# Patient Record
Sex: Female | Born: 1950 | Race: Black or African American | Hispanic: No | Marital: Married | Smoking: Former smoker
Health system: Southern US, Community
[De-identification: ages and names within clinical notes are randomized; demographics above are authoritative.]

## PROBLEM LIST (undated history)

## (undated) DIAGNOSIS — E119 Type 2 diabetes mellitus without complications: Secondary | ICD-10-CM

## (undated) DIAGNOSIS — R319 Hematuria, unspecified: Secondary | ICD-10-CM

## (undated) DIAGNOSIS — H269 Unspecified cataract: Secondary | ICD-10-CM

## (undated) DIAGNOSIS — E785 Hyperlipidemia, unspecified: Secondary | ICD-10-CM

## (undated) DIAGNOSIS — I10 Essential (primary) hypertension: Secondary | ICD-10-CM

## (undated) DIAGNOSIS — M8580 Other specified disorders of bone density and structure, unspecified site: Secondary | ICD-10-CM

## (undated) HISTORY — DX: Unspecified cataract: H26.9

## (undated) HISTORY — PX: VAGINAL HYSTERECTOMY: SUR661

## (undated) HISTORY — PX: EYE SURGERY: SHX253

## (undated) HISTORY — DX: Hyperlipidemia, unspecified: E78.5

## (undated) HISTORY — DX: Other specified disorders of bone density and structure, unspecified site: M85.80

## (undated) HISTORY — DX: Type 2 diabetes mellitus without complications: E11.9

## (undated) HISTORY — PX: FOOT SURGERY: SHX648

## (undated) HISTORY — DX: Hematuria, unspecified: R31.9

## (undated) HISTORY — DX: Essential (primary) hypertension: I10

---

## 1997-11-13 ENCOUNTER — Ambulatory Visit (HOSPITAL_COMMUNITY): Admission: RE | Admit: 1997-11-13 | Discharge: 1997-11-13 | Payer: Self-pay | Admitting: General Surgery

## 2000-06-19 ENCOUNTER — Encounter: Admission: RE | Admit: 2000-06-19 | Discharge: 2000-09-17 | Payer: Self-pay | Admitting: Family Medicine

## 2000-07-17 ENCOUNTER — Other Ambulatory Visit: Admission: RE | Admit: 2000-07-17 | Discharge: 2000-07-17 | Payer: Self-pay | Admitting: Obstetrics and Gynecology

## 2000-11-29 ENCOUNTER — Encounter: Admission: RE | Admit: 2000-11-29 | Discharge: 2001-02-27 | Payer: Self-pay | Admitting: Family Medicine

## 2002-05-02 ENCOUNTER — Encounter: Admission: RE | Admit: 2002-05-02 | Discharge: 2002-05-02 | Payer: Self-pay | Admitting: Family Medicine

## 2002-05-02 ENCOUNTER — Encounter: Payer: Self-pay | Admitting: Family Medicine

## 2004-02-25 ENCOUNTER — Other Ambulatory Visit: Admission: RE | Admit: 2004-02-25 | Discharge: 2004-02-25 | Payer: Self-pay | Admitting: Obstetrics and Gynecology

## 2006-07-03 ENCOUNTER — Emergency Department (HOSPITAL_COMMUNITY): Admission: EM | Admit: 2006-07-03 | Discharge: 2006-07-03 | Payer: Self-pay | Admitting: Emergency Medicine

## 2007-01-04 ENCOUNTER — Encounter: Admission: RE | Admit: 2007-01-04 | Discharge: 2007-01-04 | Payer: Self-pay | Admitting: Family Medicine

## 2007-08-07 ENCOUNTER — Observation Stay (HOSPITAL_COMMUNITY): Admission: EM | Admit: 2007-08-07 | Discharge: 2007-08-07 | Payer: Self-pay | Admitting: Emergency Medicine

## 2008-11-12 IMAGING — CT CT HEAD W/O CM
1 series · 16 of 30 positions shown, 20 images · IV contrast (agent unspecified)
Comparison: none

CLINICAL DATA: Headache, right arm tingling and numbness. 
 HEAD CT WITHOUT CONTRAST:
TECHNIQUE: Contiguous axial images were obtained from the base of the skull through the vertex according to standard protocol without contrast.

[Series 2: headseq 4.8 h45s · axial · 0.43mm/px · z∈[+1306,+1437]mm · 16 of 30 slices shown, 20 images]
[im 2/30  brain]
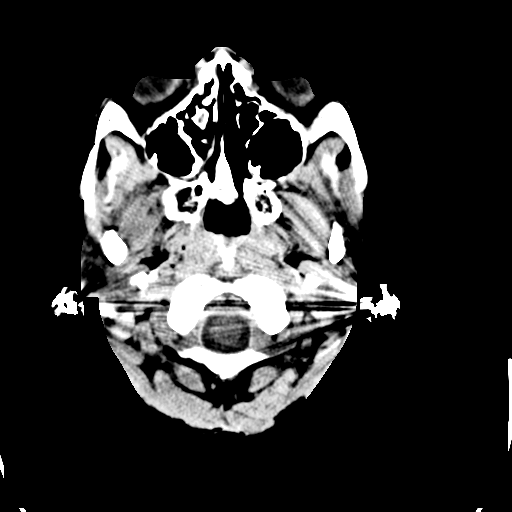
[im 2/30  bone]
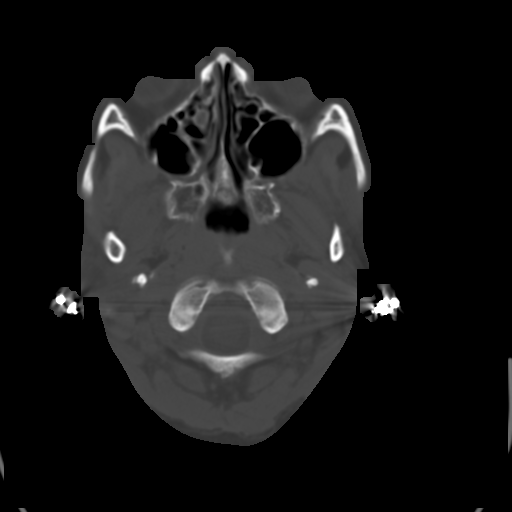
[im 4/30  brain]
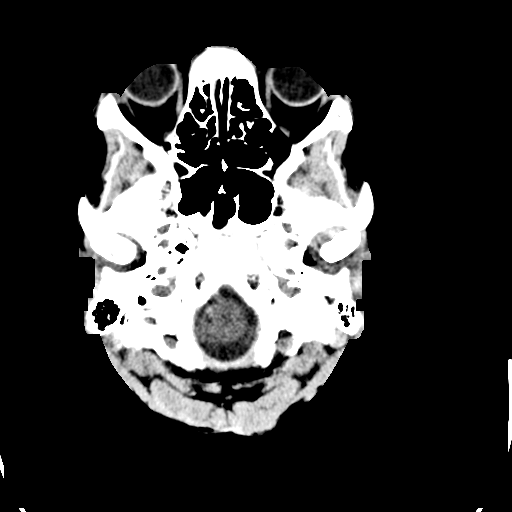
[im 6/30  brain]
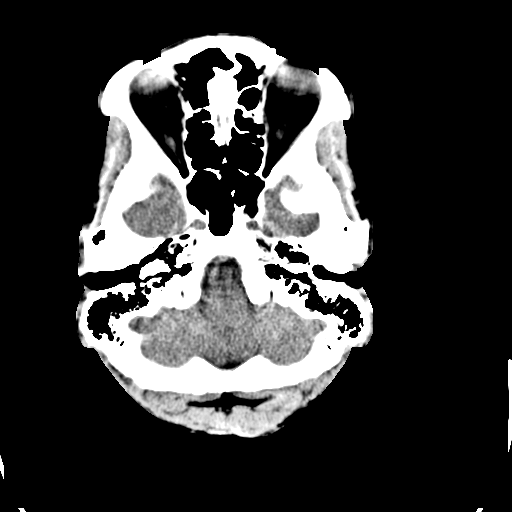
[im 8/30  brain]
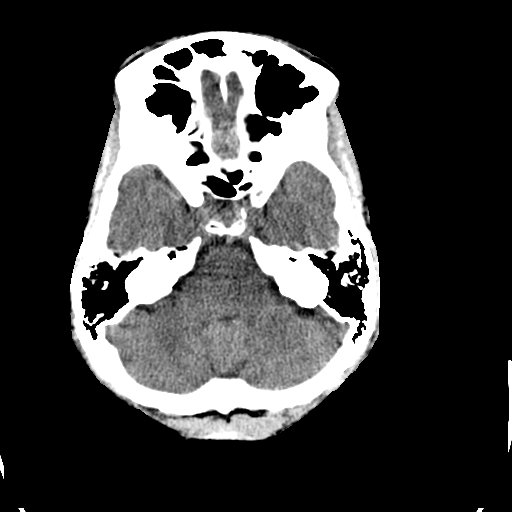
[im 9/30  brain]
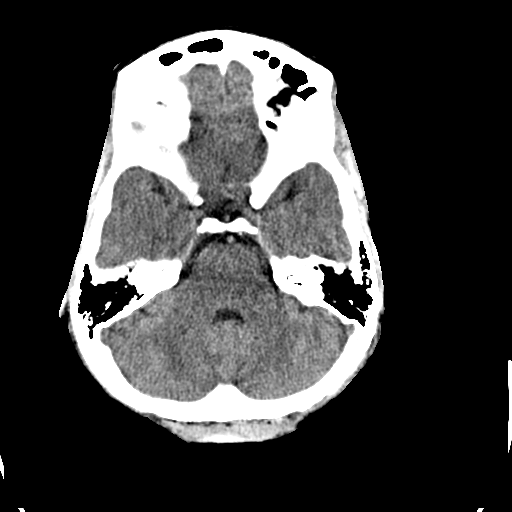
[im 9/30  bone]
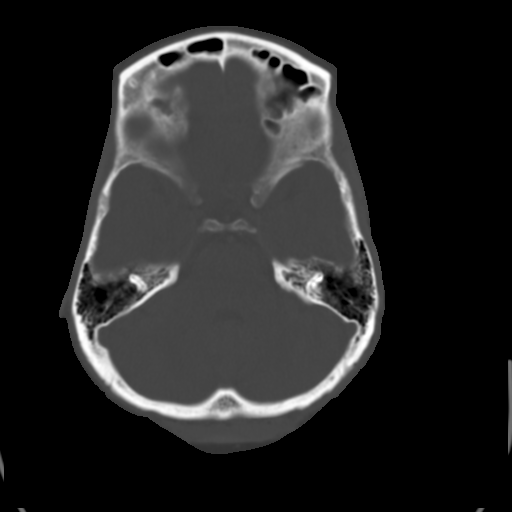
[im 11/30  brain]
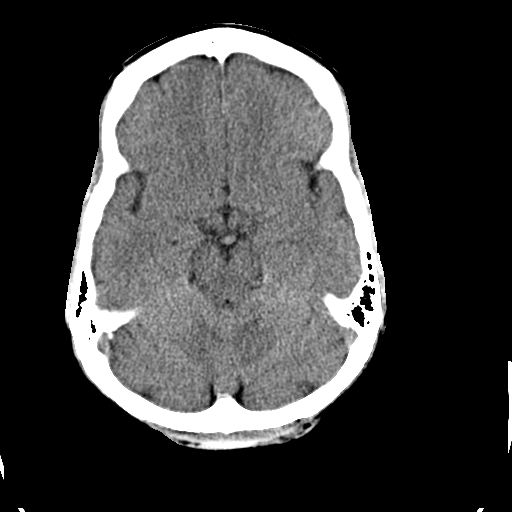
[im 13/30  brain]
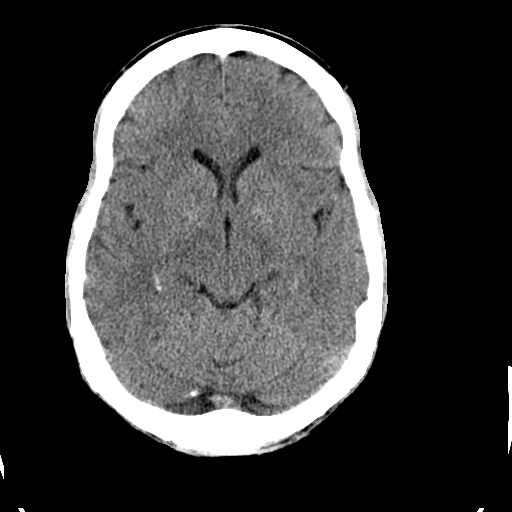
[im 15/30  brain]
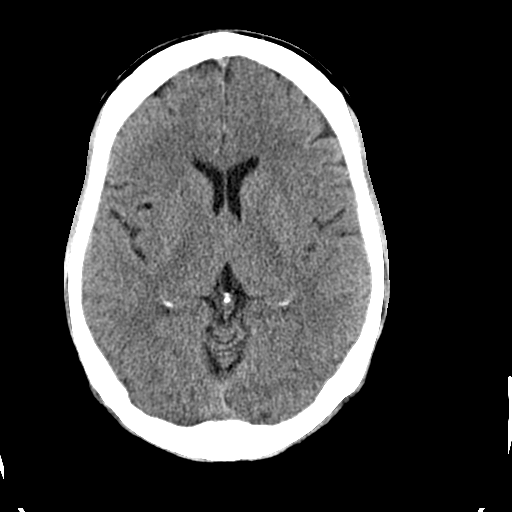
[im 16/30  brain]
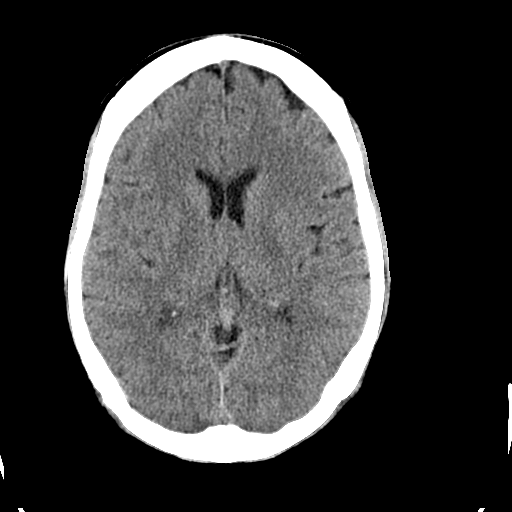
[im 16/30  bone]
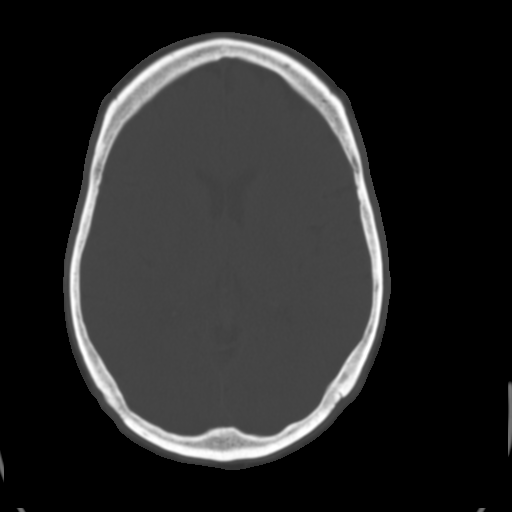
[im 18/30  brain]
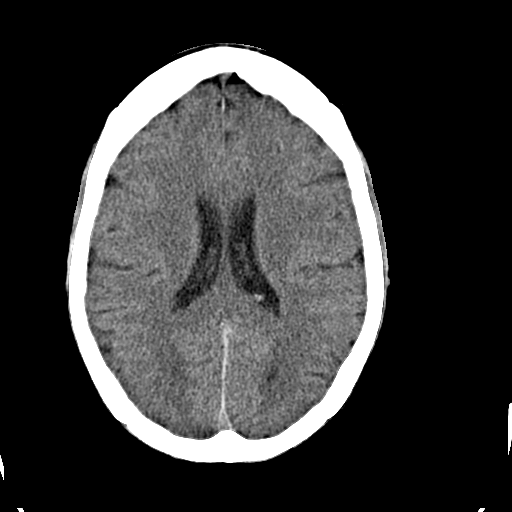
[im 20/30  brain]
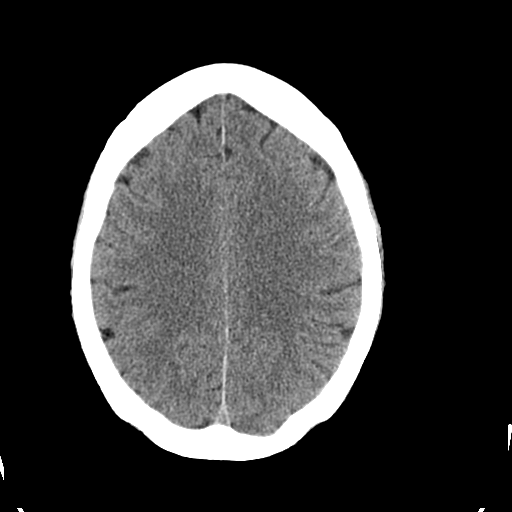
[im 22/30  brain]
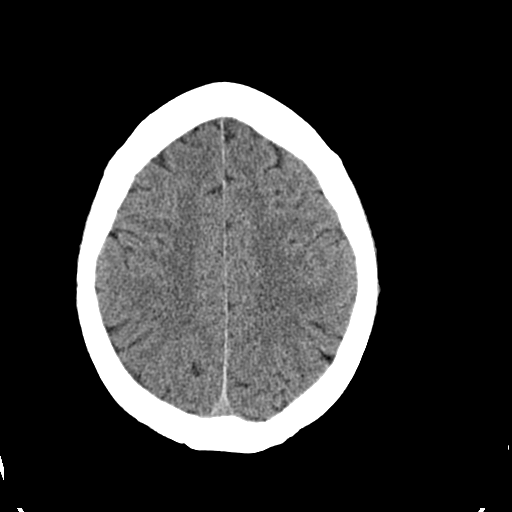
[im 23/30  brain]
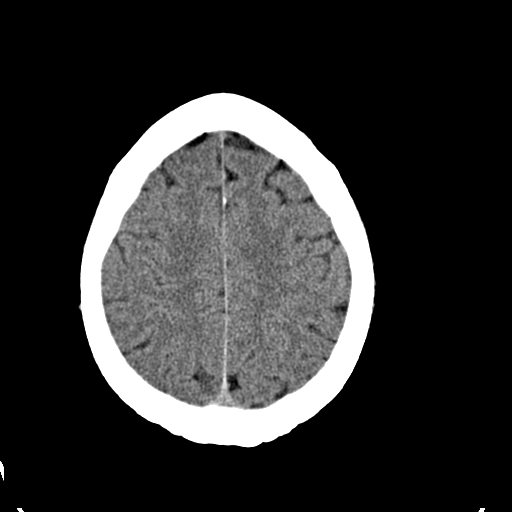
[im 23/30  bone]
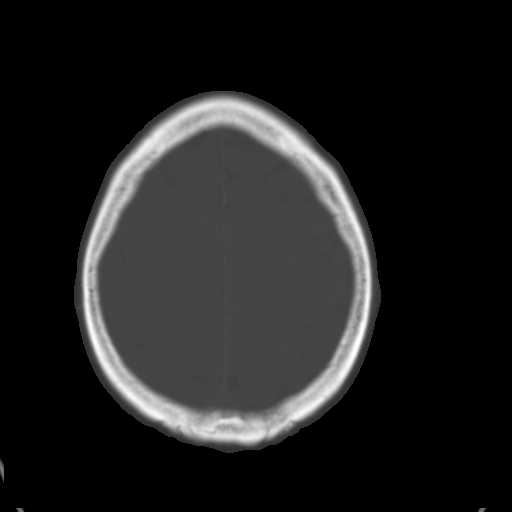
[im 25/30  brain]
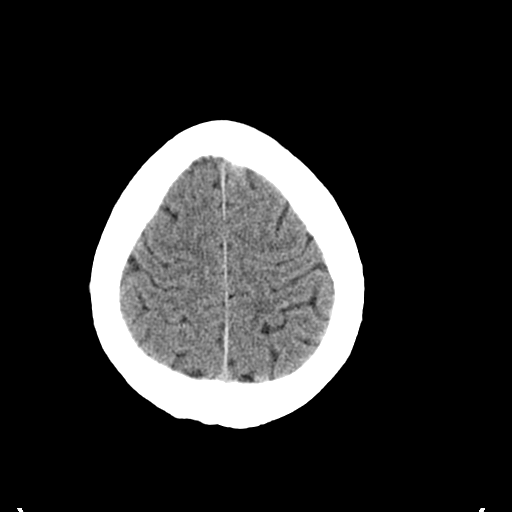
[im 27/30  brain]
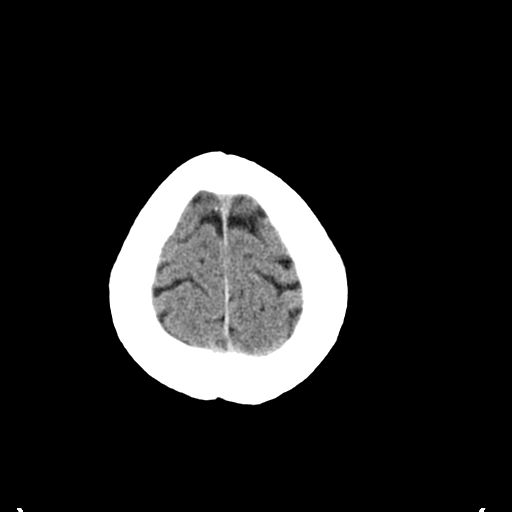
[im 29/30  brain]
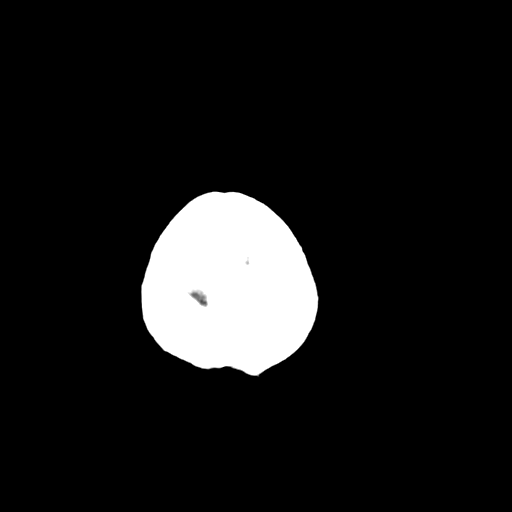

[16 of 30 positions shown; findings below may reference images not displayed]

FINDINGS: No evidence of acute intracranial abnormality identified including mass or mass effect, hydrocephalus, extra-axial fluid collection, midline shift, hemorrhage, or infarct.  Slight increased density in bilateral basal ganglia is compatible with developing calcification.  The visualized bony calvarium and paranasal sinuses are within normal limits.
IMPRESSION: No evidence of acute intracranial abnormality.

## 2008-12-12 ENCOUNTER — Emergency Department (HOSPITAL_COMMUNITY): Admission: EM | Admit: 2008-12-12 | Discharge: 2008-12-12 | Payer: Self-pay | Admitting: Emergency Medicine

## 2008-12-24 ENCOUNTER — Ambulatory Visit (HOSPITAL_COMMUNITY): Admission: RE | Admit: 2008-12-24 | Discharge: 2008-12-24 | Payer: Self-pay | Admitting: Psychiatry

## 2009-01-06 ENCOUNTER — Ambulatory Visit (HOSPITAL_COMMUNITY): Payer: Self-pay | Admitting: Psychiatry

## 2009-01-18 ENCOUNTER — Ambulatory Visit (HOSPITAL_COMMUNITY): Payer: Self-pay | Admitting: Psychiatry

## 2009-02-02 ENCOUNTER — Ambulatory Visit (HOSPITAL_COMMUNITY): Payer: Self-pay | Admitting: Psychiatry

## 2009-02-09 ENCOUNTER — Ambulatory Visit (HOSPITAL_COMMUNITY): Payer: Self-pay | Admitting: Psychiatry

## 2009-02-22 ENCOUNTER — Ambulatory Visit (HOSPITAL_COMMUNITY): Payer: Self-pay | Admitting: Psychiatry

## 2009-03-09 ENCOUNTER — Ambulatory Visit (HOSPITAL_COMMUNITY): Payer: Self-pay | Admitting: Psychiatry

## 2009-03-25 ENCOUNTER — Ambulatory Visit (HOSPITAL_COMMUNITY): Payer: Self-pay | Admitting: Psychiatry

## 2009-04-01 ENCOUNTER — Ambulatory Visit (HOSPITAL_COMMUNITY): Payer: Self-pay | Admitting: Psychiatry

## 2009-04-20 ENCOUNTER — Ambulatory Visit (HOSPITAL_COMMUNITY): Payer: Self-pay | Admitting: Psychiatry

## 2009-05-04 ENCOUNTER — Ambulatory Visit (HOSPITAL_COMMUNITY): Payer: Self-pay | Admitting: Psychiatry

## 2009-05-20 ENCOUNTER — Ambulatory Visit (HOSPITAL_COMMUNITY): Payer: Self-pay | Admitting: Psychiatry

## 2009-06-07 ENCOUNTER — Ambulatory Visit (HOSPITAL_COMMUNITY): Payer: Self-pay | Admitting: Psychiatry

## 2009-06-30 ENCOUNTER — Ambulatory Visit (HOSPITAL_COMMUNITY): Payer: Self-pay | Admitting: Psychiatry

## 2009-07-13 ENCOUNTER — Ambulatory Visit (HOSPITAL_COMMUNITY): Payer: Self-pay | Admitting: Psychiatry

## 2009-08-17 ENCOUNTER — Ambulatory Visit (HOSPITAL_COMMUNITY): Payer: Self-pay | Admitting: Psychiatry

## 2010-07-08 NOTE — Discharge Summary (Signed)
Kristen Potter, Kristen Potter                ACCOUNT NO.:  [id number]   MEDICAL RECORD NO.:  [id number]         PATIENT TYPE:  COBV   LOCATION:                               FACILITY:  [facility]   PHYSICIAN:  Madaline Savage, M.D.     DATE OF BIRTH:   DATE OF ADMISSION:  08/07/2007  DATE OF DISCHARGE:  08/08/2007                               DISCHARGE SUMMARY   DISCHARGE DIAGNOSES:  1. Left arm and chest discomfort, myocardial infarction, ruled out.  2. Treated hypertension.  3. Treated dyslipidemia.  4. Non-insulin diabetes.  5. Gastroesophageal reflux.  6. History of IV contrast and aspirin intolerance.   HOSPITAL COURSE:  The patient is a 60 year old female known to Dr.  Elsie Lincoln.  She presented with numbness and tingling in her left hand and  left shoulder and left chest.  She has multiple risk factors for  coronary disease.  She was seen by Dr. Elsie Lincoln.  He felt her symptoms  were very atypical for angina and felt her symptoms were probably from  some type of nerve impingement syndrome.  After discussion with Dr.  Elsie Lincoln, it was decided to discharge the patient on September 07, 2007.  She  will have noninvasive testing in the future.   DISCHARGE MEDICATIONS:  1. Lovastatin 40 mg a day.  2. Glipizide 2.5 mg a day.   LABS:  EKG shows sinus rhythm without acute changes.  White count 4.9,  hemoglobin 13.3, hematocrit 38.3, platelets 201, INR 1.0, sodium 139,  potassium 3.4, BUN 13, and creatinine 1.93.  LFTs were normal.  CK-MB,  troponins were negative x2.  Cholesterol is 184, HDL 69, and LDL 110.   DISPOSITION:  The patient is discharged in stable condition and will be  contacted by our office to have further evaluation as an outpatient.      Abelino Derrick, P.A.    ______________________________  Madaline Savage, M.D.    Lenard Lance  D:  08/28/2007  T:  08/29/2007  Job:  [id number]   cc:   Mosetta Putt, M.D.

## 2010-11-17 LAB — POCT CARDIAC MARKERS
CKMB, poc: 1.2
CKMB, poc: 1.6
Myoglobin, poc: 82.9
Operator id: 277751
Troponin i, poc: <0.05

## 2010-11-17 LAB — DIFFERENTIAL
Basophils Relative: 0
Eosinophils Absolute: 0.1
Eosinophils Relative: 2
Monocytes Absolute: 0.4
Monocytes Relative: 7
Neutrophils Relative %: 48

## 2010-11-17 LAB — PROTIME-INR: INR: 1

## 2010-11-17 LAB — COMPREHENSIVE METABOLIC PANEL
ALT: 24
AST: 24
Albumin: 3.9
Alkaline Phosphatase: 66
BUN: 13
CO2: 29
Calcium: 9.3
Chloride: 105
Creatinine, Ser: 0.93
GFR calc Af Amer: >60
GFR calc non Af Amer: >60
Glucose, Bld: 115 — ABNORMAL HIGH
Potassium: 3.4 — ABNORMAL LOW
Sodium: 139
Total Bilirubin: 0.4
Total Protein: 7.2

## 2010-11-17 LAB — TSH: TSH: 5.307

## 2010-11-17 LAB — CBC
HCT: 38.3
Hemoglobin: 13.3
MCHC: 34.8
MCV: 88.9
Platelets: 201
RBC: 4.31
RDW: 12.9
WBC: 4.9

## 2010-11-17 LAB — LIPID PANEL
Cholesterol: 184
HDL: 69
LDL Cholesterol: 110 — ABNORMAL HIGH
Triglycerides: 25

## 2010-11-17 LAB — CARDIAC PANEL(CRET KIN+CKTOT+MB+TROPI): Relative Index: 1.5

## 2010-11-17 LAB — MAGNESIUM: Magnesium: 2.2

## 2013-07-25 ENCOUNTER — Ambulatory Visit
Admission: RE | Admit: 2013-07-25 | Discharge: 2013-07-25 | Disposition: A | Discharge status: Home / Self Care | Payer: 59 | Source: Ambulatory Visit | Attending: Internal Medicine | Admitting: Internal Medicine

## 2013-07-25 ENCOUNTER — Other Ambulatory Visit: Payer: Self-pay | Admitting: Internal Medicine

## 2013-07-25 ENCOUNTER — Encounter (INDEPENDENT_AMBULATORY_CARE_PROVIDER_SITE_OTHER): Payer: Self-pay

## 2013-07-25 DIAGNOSIS — M7989 Other specified soft tissue disorders: Secondary | ICD-10-CM

## 2015-06-21 DEATH — deceased

## 2015-10-20 DIAGNOSIS — Z0000 Encounter for general adult medical examination without abnormal findings: Secondary | ICD-10-CM | POA: Diagnosis not present

## 2015-10-20 DIAGNOSIS — Z1389 Encounter for screening for other disorder: Secondary | ICD-10-CM | POA: Diagnosis not present

## 2015-10-20 DIAGNOSIS — I10 Essential (primary) hypertension: Secondary | ICD-10-CM | POA: Diagnosis not present

## 2015-10-20 DIAGNOSIS — M8588 Other specified disorders of bone density and structure, other site: Secondary | ICD-10-CM | POA: Diagnosis not present

## 2015-10-20 DIAGNOSIS — E139 Other specified diabetes mellitus without complications: Secondary | ICD-10-CM | POA: Diagnosis not present

## 2015-10-20 DIAGNOSIS — E7800 Pure hypercholesterolemia, unspecified: Secondary | ICD-10-CM | POA: Diagnosis not present

## 2015-11-05 ENCOUNTER — Emergency Department (HOSPITAL_BASED_OUTPATIENT_CLINIC_OR_DEPARTMENT_OTHER)
Admission: EM | Admit: 2015-11-05 | Discharge: 2015-11-05 | Disposition: A | Discharge status: Home / Self Care | Payer: PPO | Attending: Emergency Medicine | Admitting: Emergency Medicine

## 2015-11-05 ENCOUNTER — Encounter (HOSPITAL_BASED_OUTPATIENT_CLINIC_OR_DEPARTMENT_OTHER): Payer: Self-pay | Admitting: *Deleted

## 2015-11-05 DIAGNOSIS — I10 Essential (primary) hypertension: Secondary | ICD-10-CM | POA: Diagnosis not present

## 2015-11-05 DIAGNOSIS — E119 Type 2 diabetes mellitus without complications: Secondary | ICD-10-CM | POA: Diagnosis not present

## 2015-11-05 DIAGNOSIS — Z87891 Personal history of nicotine dependence: Secondary | ICD-10-CM | POA: Insufficient documentation

## 2015-11-05 DIAGNOSIS — H9192 Unspecified hearing loss, left ear: Secondary | ICD-10-CM | POA: Diagnosis not present

## 2015-11-05 DIAGNOSIS — H66002 Acute suppurative otitis media without spontaneous rupture of ear drum, left ear: Secondary | ICD-10-CM | POA: Diagnosis not present

## 2015-11-05 DIAGNOSIS — Z7982 Long term (current) use of aspirin: Secondary | ICD-10-CM | POA: Diagnosis not present

## 2015-11-05 DIAGNOSIS — Z7984 Long term (current) use of oral hypoglycemic drugs: Secondary | ICD-10-CM | POA: Diagnosis not present

## 2015-11-05 MED ORDER — AMOXICILLIN 500 MG PO CAPS: 1000.0000 mg | ORAL_CAPSULE | Freq: Two times a day (BID) | ORAL | 0 refills | Status: AC

## 2015-11-05 MED FILL — AMOXICILLIN 500 MG CAPSULE: 500 | 10 days supply | Qty: 40 | Fill #0

## 2015-11-05 NOTE — Discharge Instructions (Signed)
Follow up with your family doc, If your hearing does not improve you may need to see a ENT.

## 2015-11-05 NOTE — ED Provider Notes (Signed)
[facility]   CSN: [id number] Arrival date & time: 11/05/15  1620     History   Chief Complaint Chief Complaint  Patient presents with  . Ear Fullness    HPI Kristen Potter is a 65 y.o. female.  L ear fulllness for 2 days, now with hearing loss.  Patient states that she felt that her ears were really clogged consider clean them out extensively yesterday. Today her hearing is completely gone according to her. Feels that it's full as a sensation of fluid sloshing around it as she rotates her head. Denies current dizziness but has had dizziness in the past. Denies current ring in her ears though has had that persistently for the past 15 years. Denies head injury denies other neurologic symptoms.   The history is provided by the patient.  Ear Fullness  This is a new problem. The current episode started less than 1 hour ago. The problem occurs constantly. The problem has not changed since onset.Pertinent negatives include no chest pain, no headaches and no shortness of breath. Nothing aggravates the symptoms. Nothing relieves the symptoms. She has tried nothing for the symptoms. The treatment provided no relief.    Past Medical History:  Diagnosis Date  . Cataract   . Diabetes mellitus without complication (HCC)   . Hematuria   . Hyperlipidemia   . Hypertension   . Osteopenia     There are no active problems to display for this patient.   Past Surgical History:  Procedure Laterality Date  . FOOT SURGERY Right   . VAGINAL HYSTERECTOMY      OB History    No data available       Home Medications    Prior to Admission medications   Medication Sig Start Date End Date Taking? Authorizing Provider  amoxicillin (AMOXIL) 500 MG capsule Take 2 capsules (1,000 mg total) by mouth 2 (two) times daily. 11/05/15   Melene Plan, DO  aspirin 81 MG tablet Take 81 mg by mouth daily.    Historical Provider, MD  Calcium Carb-Cholecalciferol (CALCIUM 600 + D PO)  Take by mouth.    Historical Provider, MD  glipiZIDE (GLUCOTROL XL) 2.5 MG 24 hr tablet Take 2.5 mg by mouth daily with breakfast.    Historical Provider, MD  hydrochlorothiazide (HYDRODIURIL) 25 MG tablet Take 25 mg by mouth daily.    Historical Provider, MD  lisinopril (PRINIVIL,ZESTRIL) 20 MG tablet Take 20 mg by mouth daily.    Historical Provider, MD  metFORMIN (GLUCOPHAGE) 500 MG tablet Take by mouth 2 (two) times daily with a meal.    Historical Provider, MD  simvastatin (ZOCOR) 40 MG tablet Take 40 mg by mouth daily.    Historical Provider, MD    Family History No family history on file.  Social History Social History  Substance Use Topics  . Smoking status: Former Games developer  . Smokeless tobacco: Not on file  . Alcohol use No     Allergies   Review of patient's allergies indicates no known allergies.   Review of Systems Review of Systems  Constitutional: Negative for chills and fever.  HENT: Positive for hearing loss. Negative for congestion and rhinorrhea.   Eyes: Negative for redness and visual disturbance.  Respiratory: Negative for shortness of breath and wheezing.   Cardiovascular: Negative for chest pain and palpitations.  Gastrointestinal: Negative for nausea and vomiting.  Genitourinary: Negative for dysuria and urgency.  Musculoskeletal: Negative for arthralgias and myalgias.  Skin: Negative  for pallor and wound.  Neurological: Positive for dizziness (off and on for years). Negative for headaches.     Physical Exam Updated Vital Signs BP 128/69   Pulse 96   Temp 97.9 F (36.6 C) (Oral)   Resp 16   Ht 5\' 6"  (1.676 m)   Wt 143 lb (64.9 kg)   SpO2 100%   BMI 23.08 kg/m   Physical Exam  Constitutional: She is oriented to person, place, and time. She appears well-developed and well-nourished. No distress.  HENT:  Head: Normocephalic and atraumatic.  Right Ear: Tympanic membrane normal.  Left Ear: Ear canal normal. Tympanic membrane is bulging. A  middle ear effusion is present. Decreased hearing is noted.  Bulging with redness and effusion  Eyes: EOM are normal. Pupils are equal, round, and reactive to light.  Neck: Normal range of motion. Neck supple.  Cardiovascular: Normal rate and regular rhythm.  Exam reveals no gallop and no friction rub.   No murmur heard. Pulmonary/Chest: Effort normal. She has no wheezes. She has no rales.  Abdominal: Soft. She exhibits no distension. There is no tenderness.  Musculoskeletal: She exhibits no edema or tenderness.  Neurological: She is alert and oriented to person, place, and time.  Skin: Skin is warm and dry. She is not diaphoretic.  Psychiatric: She has a normal mood and affect. Her behavior is normal.  Nursing note and vitals reviewed.    ED Treatments / Results  Labs (all labs ordered are listed, but only abnormal results are displayed) Labs Reviewed - No data to display  EKG  EKG Interpretation None       Radiology No results found.  Procedures Procedures (including critical care time)  Medications Ordered in ED Medications - No data to display   Initial Impression / Assessment and Plan / ED Course  I have reviewed the triage vital signs and the nursing notes.  Pertinent labs & imaging results that were available during my care of the patient were reviewed by me and considered in my medical decision making (see chart for details).  Clinical Course    65 yo F With a chief complaint of left-sided ear fullness. My exam patient has an effusion with bulging TM. She has mild erythema to the drum as well. Will treat for otitis. Have the patient follow-up with her family physician. If her hearing does not improve she likely need to see an ear nose and throat physician. Patient has had some off and on vertiginous symptoms as well as tinnitus. These do not seem to coincide.  4:51 PM:  I have discussed the diagnosis/risks/treatment options with the patient and believe the pt  to be eligible for discharge home to follow-up with PCP. We also discussed returning to the ED immediately if new or worsening sx occur. We discussed the sx which are most concerning (e.g., other neuro s/sx, continued hearing loss post abx) that necessitate immediate return. Medications administered to the patient during their visit and any new prescriptions provided to the patient are listed below.  Medications given during this visit Medications - No data to display   The patient appears reasonably screen and/or stabilized for discharge and I doubt any other medical condition or other [facility] requiring further screening, evaluation, or treatment in the ED at this time prior to discharge.    Final Clinical Impressions(s) / ED Diagnoses   Final diagnoses:  Acute suppurative otitis media of left ear without spontaneous rupture of tympanic membrane, recurrence not specified  New Prescriptions New Prescriptions   AMOXICILLIN (AMOXIL) 500 MG CAPSULE    Take 2 capsules (1,000 mg total) by mouth 2 (two) times daily.     Melene Plan, DO 11/05/15 1651

## 2015-11-05 NOTE — ED Triage Notes (Signed)
Pt c/o left ear fullness and haring loss x 2 days

## 2015-12-17 DIAGNOSIS — R0789 Other chest pain: Secondary | ICD-10-CM | POA: Diagnosis not present

## 2016-01-12 DIAGNOSIS — Z1231 Encounter for screening mammogram for malignant neoplasm of breast: Secondary | ICD-10-CM | POA: Diagnosis not present

## 2016-01-12 DIAGNOSIS — Z803 Family history of malignant neoplasm of breast: Secondary | ICD-10-CM | POA: Diagnosis not present

## 2016-04-20 DIAGNOSIS — E139 Other specified diabetes mellitus without complications: Secondary | ICD-10-CM | POA: Diagnosis not present

## 2016-04-20 DIAGNOSIS — E7800 Pure hypercholesterolemia, unspecified: Secondary | ICD-10-CM | POA: Diagnosis not present

## 2016-04-20 DIAGNOSIS — R21 Rash and other nonspecific skin eruption: Secondary | ICD-10-CM | POA: Diagnosis not present

## 2016-04-20 DIAGNOSIS — M8580 Other specified disorders of bone density and structure, unspecified site: Secondary | ICD-10-CM | POA: Diagnosis not present

## 2016-04-20 DIAGNOSIS — I10 Essential (primary) hypertension: Secondary | ICD-10-CM | POA: Diagnosis not present

## 2016-05-03 DIAGNOSIS — H52203 Unspecified astigmatism, bilateral: Secondary | ICD-10-CM | POA: Diagnosis not present

## 2016-05-03 DIAGNOSIS — H524 Presbyopia: Secondary | ICD-10-CM | POA: Diagnosis not present

## 2016-05-03 DIAGNOSIS — H25013 Cortical age-related cataract, bilateral: Secondary | ICD-10-CM | POA: Diagnosis not present

## 2016-05-03 DIAGNOSIS — H5203 Hypermetropia, bilateral: Secondary | ICD-10-CM | POA: Diagnosis not present

## 2016-05-03 DIAGNOSIS — H2513 Age-related nuclear cataract, bilateral: Secondary | ICD-10-CM | POA: Diagnosis not present

## 2016-05-03 DIAGNOSIS — E119 Type 2 diabetes mellitus without complications: Secondary | ICD-10-CM | POA: Diagnosis not present

## 2016-05-24 DIAGNOSIS — H2513 Age-related nuclear cataract, bilateral: Secondary | ICD-10-CM | POA: Diagnosis not present

## 2016-05-24 DIAGNOSIS — H25013 Cortical age-related cataract, bilateral: Secondary | ICD-10-CM | POA: Diagnosis not present

## 2016-06-06 DIAGNOSIS — H2511 Age-related nuclear cataract, right eye: Secondary | ICD-10-CM | POA: Diagnosis not present

## 2016-06-06 DIAGNOSIS — H35352 Cystoid macular degeneration, left eye: Secondary | ICD-10-CM | POA: Diagnosis not present

## 2016-06-06 DIAGNOSIS — H34812 Central retinal vein occlusion, left eye, with macular edema: Secondary | ICD-10-CM | POA: Diagnosis not present

## 2016-06-06 DIAGNOSIS — H2512 Age-related nuclear cataract, left eye: Secondary | ICD-10-CM | POA: Diagnosis not present

## 2016-06-22 DIAGNOSIS — H35352 Cystoid macular degeneration, left eye: Secondary | ICD-10-CM | POA: Diagnosis not present

## 2016-06-22 DIAGNOSIS — H34812 Central retinal vein occlusion, left eye, with macular edema: Secondary | ICD-10-CM | POA: Diagnosis not present

## 2016-07-24 DIAGNOSIS — H35352 Cystoid macular degeneration, left eye: Secondary | ICD-10-CM | POA: Diagnosis not present

## 2016-07-24 DIAGNOSIS — H34812 Central retinal vein occlusion, left eye, with macular edema: Secondary | ICD-10-CM | POA: Diagnosis not present

## 2016-09-14 DIAGNOSIS — H2512 Age-related nuclear cataract, left eye: Secondary | ICD-10-CM | POA: Diagnosis not present

## 2016-09-14 DIAGNOSIS — H35352 Cystoid macular degeneration, left eye: Secondary | ICD-10-CM | POA: Diagnosis not present

## 2016-09-14 DIAGNOSIS — H34832 Tributary (branch) retinal vein occlusion, left eye, with macular edema: Secondary | ICD-10-CM | POA: Diagnosis not present

## 2016-09-14 DIAGNOSIS — H3562 Retinal hemorrhage, left eye: Secondary | ICD-10-CM | POA: Diagnosis not present

## 2016-09-14 DIAGNOSIS — H34812 Central retinal vein occlusion, left eye, with macular edema: Secondary | ICD-10-CM | POA: Diagnosis not present

## 2016-11-03 DIAGNOSIS — E7800 Pure hypercholesterolemia, unspecified: Secondary | ICD-10-CM | POA: Diagnosis not present

## 2016-11-03 DIAGNOSIS — H34812 Central retinal vein occlusion, left eye, with macular edema: Secondary | ICD-10-CM | POA: Diagnosis not present

## 2016-11-03 DIAGNOSIS — M8588 Other specified disorders of bone density and structure, other site: Secondary | ICD-10-CM | POA: Diagnosis not present

## 2016-11-03 DIAGNOSIS — E119 Type 2 diabetes mellitus without complications: Secondary | ICD-10-CM | POA: Diagnosis not present

## 2016-11-03 DIAGNOSIS — Z1389 Encounter for screening for other disorder: Secondary | ICD-10-CM | POA: Diagnosis not present

## 2016-11-03 DIAGNOSIS — Z0000 Encounter for general adult medical examination without abnormal findings: Secondary | ICD-10-CM | POA: Diagnosis not present

## 2016-11-03 DIAGNOSIS — I10 Essential (primary) hypertension: Secondary | ICD-10-CM | POA: Diagnosis not present

## 2016-11-16 DIAGNOSIS — H35352 Cystoid macular degeneration, left eye: Secondary | ICD-10-CM | POA: Diagnosis not present

## 2016-11-16 DIAGNOSIS — H34812 Central retinal vein occlusion, left eye, with macular edema: Secondary | ICD-10-CM | POA: Diagnosis not present

## 2016-11-16 DIAGNOSIS — H2512 Age-related nuclear cataract, left eye: Secondary | ICD-10-CM | POA: Diagnosis not present

## 2016-11-16 DIAGNOSIS — H2511 Age-related nuclear cataract, right eye: Secondary | ICD-10-CM | POA: Diagnosis not present

## 2017-01-22 DIAGNOSIS — M8589 Other specified disorders of bone density and structure, multiple sites: Secondary | ICD-10-CM | POA: Diagnosis not present

## 2017-01-22 DIAGNOSIS — Z1231 Encounter for screening mammogram for malignant neoplasm of breast: Secondary | ICD-10-CM | POA: Diagnosis not present

## 2017-03-05 DIAGNOSIS — H3562 Retinal hemorrhage, left eye: Secondary | ICD-10-CM | POA: Diagnosis not present

## 2017-03-05 DIAGNOSIS — H34812 Central retinal vein occlusion, left eye, with macular edema: Secondary | ICD-10-CM | POA: Diagnosis not present

## 2017-03-05 DIAGNOSIS — H35042 Retinal micro-aneurysms, unspecified, left eye: Secondary | ICD-10-CM | POA: Diagnosis not present

## 2017-03-05 DIAGNOSIS — H35352 Cystoid macular degeneration, left eye: Secondary | ICD-10-CM | POA: Diagnosis not present

## 2017-03-26 DIAGNOSIS — L121 Cicatricial pemphigoid: Secondary | ICD-10-CM | POA: Diagnosis not present

## 2017-05-03 DIAGNOSIS — H2513 Age-related nuclear cataract, bilateral: Secondary | ICD-10-CM | POA: Diagnosis not present

## 2017-05-03 DIAGNOSIS — H524 Presbyopia: Secondary | ICD-10-CM | POA: Diagnosis not present

## 2017-05-03 DIAGNOSIS — H52203 Unspecified astigmatism, bilateral: Secondary | ICD-10-CM | POA: Diagnosis not present

## 2017-05-03 DIAGNOSIS — H5203 Hypermetropia, bilateral: Secondary | ICD-10-CM | POA: Diagnosis not present

## 2017-05-03 DIAGNOSIS — E119 Type 2 diabetes mellitus without complications: Secondary | ICD-10-CM | POA: Diagnosis not present

## 2017-05-03 DIAGNOSIS — H25013 Cortical age-related cataract, bilateral: Secondary | ICD-10-CM | POA: Diagnosis not present

## 2017-05-30 DIAGNOSIS — E7800 Pure hypercholesterolemia, unspecified: Secondary | ICD-10-CM | POA: Diagnosis not present

## 2017-05-30 DIAGNOSIS — M8588 Other specified disorders of bone density and structure, other site: Secondary | ICD-10-CM | POA: Diagnosis not present

## 2017-05-30 DIAGNOSIS — E119 Type 2 diabetes mellitus without complications: Secondary | ICD-10-CM | POA: Diagnosis not present

## 2017-05-30 DIAGNOSIS — I10 Essential (primary) hypertension: Secondary | ICD-10-CM | POA: Diagnosis not present

## 2017-07-30 DIAGNOSIS — H2512 Age-related nuclear cataract, left eye: Secondary | ICD-10-CM | POA: Diagnosis not present

## 2017-07-30 DIAGNOSIS — H2511 Age-related nuclear cataract, right eye: Secondary | ICD-10-CM | POA: Diagnosis not present

## 2017-07-30 DIAGNOSIS — H348122 Central retinal vein occlusion, left eye, stable: Secondary | ICD-10-CM | POA: Diagnosis not present

## 2017-07-30 DIAGNOSIS — H35042 Retinal micro-aneurysms, unspecified, left eye: Secondary | ICD-10-CM | POA: Diagnosis not present

## 2017-07-30 DIAGNOSIS — H35352 Cystoid macular degeneration, left eye: Secondary | ICD-10-CM | POA: Diagnosis not present

## 2017-08-10 DIAGNOSIS — A084 Viral intestinal infection, unspecified: Secondary | ICD-10-CM | POA: Diagnosis not present

## 2017-11-06 DIAGNOSIS — L121 Cicatricial pemphigoid: Secondary | ICD-10-CM | POA: Diagnosis not present

## 2017-12-03 DIAGNOSIS — I10 Essential (primary) hypertension: Secondary | ICD-10-CM | POA: Diagnosis not present

## 2017-12-03 DIAGNOSIS — Z1389 Encounter for screening for other disorder: Secondary | ICD-10-CM | POA: Diagnosis not present

## 2017-12-03 DIAGNOSIS — E7800 Pure hypercholesterolemia, unspecified: Secondary | ICD-10-CM | POA: Diagnosis not present

## 2017-12-03 DIAGNOSIS — E1169 Type 2 diabetes mellitus with other specified complication: Secondary | ICD-10-CM | POA: Diagnosis not present

## 2017-12-03 DIAGNOSIS — Z0000 Encounter for general adult medical examination without abnormal findings: Secondary | ICD-10-CM | POA: Diagnosis not present

## 2018-01-23 DIAGNOSIS — Z1231 Encounter for screening mammogram for malignant neoplasm of breast: Secondary | ICD-10-CM | POA: Diagnosis not present

## 2018-05-30 DIAGNOSIS — M8588 Other specified disorders of bone density and structure, other site: Secondary | ICD-10-CM | POA: Diagnosis not present

## 2018-05-30 DIAGNOSIS — E7800 Pure hypercholesterolemia, unspecified: Secondary | ICD-10-CM | POA: Diagnosis not present

## 2018-05-30 DIAGNOSIS — I10 Essential (primary) hypertension: Secondary | ICD-10-CM | POA: Diagnosis not present

## 2018-05-30 DIAGNOSIS — E1169 Type 2 diabetes mellitus with other specified complication: Secondary | ICD-10-CM | POA: Diagnosis not present

## 2018-06-06 DIAGNOSIS — Z7984 Long term (current) use of oral hypoglycemic drugs: Secondary | ICD-10-CM | POA: Diagnosis not present

## 2018-06-06 DIAGNOSIS — E1169 Type 2 diabetes mellitus with other specified complication: Secondary | ICD-10-CM | POA: Diagnosis not present

## 2018-06-06 DIAGNOSIS — I10 Essential (primary) hypertension: Secondary | ICD-10-CM | POA: Diagnosis not present

## 2018-06-06 DIAGNOSIS — E7800 Pure hypercholesterolemia, unspecified: Secondary | ICD-10-CM | POA: Diagnosis not present

## 2018-08-01 DIAGNOSIS — H2511 Age-related nuclear cataract, right eye: Secondary | ICD-10-CM | POA: Diagnosis not present

## 2018-08-01 DIAGNOSIS — H2512 Age-related nuclear cataract, left eye: Secondary | ICD-10-CM | POA: Diagnosis not present

## 2018-08-01 DIAGNOSIS — H348122 Central retinal vein occlusion, left eye, stable: Secondary | ICD-10-CM | POA: Diagnosis not present

## 2018-08-01 DIAGNOSIS — H35352 Cystoid macular degeneration, left eye: Secondary | ICD-10-CM | POA: Diagnosis not present

## 2019-01-09 DIAGNOSIS — E7800 Pure hypercholesterolemia, unspecified: Secondary | ICD-10-CM | POA: Diagnosis not present

## 2019-01-09 DIAGNOSIS — Z0000 Encounter for general adult medical examination without abnormal findings: Secondary | ICD-10-CM | POA: Diagnosis not present

## 2019-01-09 DIAGNOSIS — E1169 Type 2 diabetes mellitus with other specified complication: Secondary | ICD-10-CM | POA: Diagnosis not present

## 2019-01-09 DIAGNOSIS — Z1389 Encounter for screening for other disorder: Secondary | ICD-10-CM | POA: Diagnosis not present

## 2019-01-09 DIAGNOSIS — I10 Essential (primary) hypertension: Secondary | ICD-10-CM | POA: Diagnosis not present

## 2019-01-09 DIAGNOSIS — M8588 Other specified disorders of bone density and structure, other site: Secondary | ICD-10-CM | POA: Diagnosis not present

## 2019-01-21 DIAGNOSIS — H524 Presbyopia: Secondary | ICD-10-CM | POA: Diagnosis not present

## 2019-01-21 DIAGNOSIS — E119 Type 2 diabetes mellitus without complications: Secondary | ICD-10-CM | POA: Diagnosis not present

## 2019-01-21 DIAGNOSIS — H52203 Unspecified astigmatism, bilateral: Secondary | ICD-10-CM | POA: Diagnosis not present

## 2019-01-21 DIAGNOSIS — H5203 Hypermetropia, bilateral: Secondary | ICD-10-CM | POA: Diagnosis not present

## 2019-01-21 DIAGNOSIS — H25813 Combined forms of age-related cataract, bilateral: Secondary | ICD-10-CM | POA: Diagnosis not present

## 2019-01-28 DIAGNOSIS — Z803 Family history of malignant neoplasm of breast: Secondary | ICD-10-CM | POA: Diagnosis not present

## 2019-01-28 DIAGNOSIS — Z1231 Encounter for screening mammogram for malignant neoplasm of breast: Secondary | ICD-10-CM | POA: Diagnosis not present

## 2019-02-03 DIAGNOSIS — Z90710 Acquired absence of both cervix and uterus: Secondary | ICD-10-CM | POA: Diagnosis not present

## 2019-02-03 DIAGNOSIS — M810 Age-related osteoporosis without current pathological fracture: Secondary | ICD-10-CM | POA: Diagnosis not present

## 2019-05-13 DIAGNOSIS — E7800 Pure hypercholesterolemia, unspecified: Secondary | ICD-10-CM | POA: Diagnosis not present

## 2019-05-13 DIAGNOSIS — E782 Mixed hyperlipidemia: Secondary | ICD-10-CM | POA: Diagnosis not present

## 2019-05-13 DIAGNOSIS — Z7984 Long term (current) use of oral hypoglycemic drugs: Secondary | ICD-10-CM | POA: Diagnosis not present

## 2019-05-13 DIAGNOSIS — I10 Essential (primary) hypertension: Secondary | ICD-10-CM | POA: Diagnosis not present

## 2019-05-13 DIAGNOSIS — E119 Type 2 diabetes mellitus without complications: Secondary | ICD-10-CM | POA: Diagnosis not present

## 2019-07-14 DIAGNOSIS — I10 Essential (primary) hypertension: Secondary | ICD-10-CM | POA: Diagnosis not present

## 2019-07-14 DIAGNOSIS — M810 Age-related osteoporosis without current pathological fracture: Secondary | ICD-10-CM | POA: Diagnosis not present

## 2019-07-14 DIAGNOSIS — E7800 Pure hypercholesterolemia, unspecified: Secondary | ICD-10-CM | POA: Diagnosis not present

## 2019-07-14 DIAGNOSIS — E1169 Type 2 diabetes mellitus with other specified complication: Secondary | ICD-10-CM | POA: Diagnosis not present

## 2019-08-05 ENCOUNTER — Encounter (INDEPENDENT_AMBULATORY_CARE_PROVIDER_SITE_OTHER): Payer: PPO | Admitting: Ophthalmology

## 2019-08-12 ENCOUNTER — Encounter (INDEPENDENT_AMBULATORY_CARE_PROVIDER_SITE_OTHER): Payer: Self-pay | Admitting: Ophthalmology

## 2019-08-12 ENCOUNTER — Other Ambulatory Visit: Payer: Self-pay

## 2019-08-12 ENCOUNTER — Ambulatory Visit (INDEPENDENT_AMBULATORY_CARE_PROVIDER_SITE_OTHER): Payer: PPO | Admitting: Ophthalmology

## 2019-08-12 DIAGNOSIS — H2512 Age-related nuclear cataract, left eye: Secondary | ICD-10-CM

## 2019-08-12 DIAGNOSIS — H348122 Central retinal vein occlusion, left eye, stable: Secondary | ICD-10-CM | POA: Insufficient documentation

## 2019-08-12 DIAGNOSIS — H35352 Cystoid macular degeneration, left eye: Secondary | ICD-10-CM

## 2019-08-12 DIAGNOSIS — H2511 Age-related nuclear cataract, right eye: Secondary | ICD-10-CM | POA: Diagnosis not present

## 2019-08-12 HISTORY — DX: Central retinal vein occlusion, left eye, stable: H34.8122

## 2019-08-12 HISTORY — DX: Age-related nuclear cataract, left eye: H25.12

## 2019-08-12 HISTORY — DX: Age-related nuclear cataract, right eye: H25.11

## 2019-08-12 HISTORY — DX: Cystoid macular degeneration, left eye: H35.352

## 2019-08-12 NOTE — Progress Notes (Signed)
08/12/2019     CHIEF COMPLAINT Patient presents for Retina Follow Up   HISTORY OF PRESENT ILLNESS: Kristen Potter is a 69 y.o. female who presents to the clinic today for:   HPI    Retina Follow Up    Patient presents with  CRVO/BRVO.  In left eye.  Severity is moderate.  Duration of 1 year.  Since onset it is stable.  I, the attending physician,  performed the HPI with the patient and updated documentation appropriately.          Comments    1 Year f\u OU. FP  Pt states vision is doing great. Pt knows cataracts are becoming worse. Pt saw Dr. Nile Riggs last year.  BGL: 112 A1C: 7's       Last edited by Elyse Jarvis on 08/12/2019 10:45 AM. (History)      Referring physician: Edmon Crape, MD [address] [place],  [place] [address]  HISTORICAL INFORMATION:   Selected notes from the MEDICAL RECORD NUMBER       CURRENT MEDICATIONS: No current outpatient medications on file. (Ophthalmic Drugs)   No current facility-administered medications for this visit. (Ophthalmic Drugs)   Current Outpatient Medications (Other)  Medication Sig  . amoxicillin (AMOXIL) 500 MG capsule Take 2 capsules (1,000 mg total) by mouth 2 (two) times daily.  Marland Kitchen aspirin 81 MG tablet Take 81 mg by mouth daily. (Patient not taking: Reported on 08/12/2019)  . Calcium Carb-Cholecalciferol (CALCIUM 600 + D PO) Take by mouth.  Marland Kitchen glipiZIDE (GLUCOTROL XL) 2.5 MG 24 hr tablet Take 2.5 mg by mouth daily with breakfast.  . hydrochlorothiazide (HYDRODIURIL) 25 MG tablet Take 25 mg by mouth daily.  Marland Kitchen lisinopril (PRINIVIL,ZESTRIL) 20 MG tablet Take 20 mg by mouth daily.  . metFORMIN (GLUCOPHAGE) 500 MG tablet Take by mouth 2 (two) times daily with a meal.  . simvastatin (ZOCOR) 40 MG tablet Take 40 mg by mouth daily.   No current facility-administered medications for this visit. (Other)      REVIEW OF SYSTEMS: ROS    Positive for: Endocrine   Last edited by Elyse Jarvis on 08/12/2019 10:36  AM. (History)       ALLERGIES Allergies  Allergen Reactions  . Aspirin Nausea And Vomiting    PAST MEDICAL HISTORY Past Medical History:  Diagnosis Date  . Cataract   . Diabetes mellitus without complication (HCC)   . Hematuria   . Hyperlipidemia   . Hypertension   . Osteopenia    Past Surgical History:  Procedure Laterality Date  . FOOT SURGERY Right   . VAGINAL HYSTERECTOMY      FAMILY HISTORY History reviewed. No pertinent family history.  SOCIAL HISTORY Social History   Tobacco Use  . Smoking status: Former Games developer  . Smokeless tobacco: Never Used  Substance Use Topics  . Alcohol use: No  . Drug use: Not on file         OPHTHALMIC EXAM:  Base Eye Exam    Visual Acuity (Snellen - Linear)      Right Left   Dist cc 20/20 -1 20/30 -1   Correction: Glasses       Tonometry (Tonopen, 10:43 AM)      Right Left   Pressure 13 13       Pupils      Pupils Dark Light Shape React APD   Right PERRL 4 3 Round Brisk None   Left PERRL 4 3 Round Brisk None  Visual Fields (Counting fingers)      Left Right    Full Full       Neuro/Psych    Oriented x3: Yes   Mood/Affect: Normal       Dilation    Both eyes: 1.0% Mydriacyl, 2.5% Phenylephrine @ 10:43 AM        Slit Lamp and Fundus Exam    External Exam      Right Left   External Normal Normal       Slit Lamp Exam      Right Left   Lids/Lashes Normal Normal   Conjunctiva/Sclera White and quiet White and quiet   Cornea Clear Clear   Anterior Chamber Deep and quiet Deep and quiet   Iris Round and reactive Round and reactive   Lens 2+ Nuclear sclerosis 2+ Nuclear sclerosis   Anterior Vitreous Normal Normal       Fundus Exam      Right Left   Posterior Vitreous Normal Normal   Disc Normal Collaterals   C/D Ratio  0.55   Macula Normal Microaneurysms, Exudates   Vessels Normal Old Hemi-CRVO    Periphery Normal Regional white lines of nonperfusion distal inferotemporal venous arcade,  retinal nonperfusion          IMAGING AND PROCEDURES  Imaging and Procedures for 08/12/19  Color Fundus Photography Optos - OU - Both Eyes       Right Eye Progression has been stable. Disc findings include normal observations. Macula : normal observations. Vessels : normal observations. Periphery : normal observations.   Left Eye Progression has been stable. Macula : microaneurysms, edema.   Notes     OS; regional white lines of nonperfusion distal inferotemporal venous arcade, retinal nonperfusion, secondary to prior old hemicentral retinal vein occlusion.  Large region of nonperfusion poses risk of neovascularization elsewhere.   OS with collaterals on the optic nerve                  ASSESSMENT/PLAN:  No problem-specific Assessment & Plan notes found for this encounter.      ICD-10-CM   1. Central retinal vein occlusion of left eye, unspecified complication status  H34.8122 Color Fundus Photography Optos - OU - Both Eyes  2. Cystoid macular edema of left eye  H35.352 Color Fundus Photography Optos - OU - Both Eyes  3. Age-related nuclear cataract of right eye  H25.11   4. Age-related nuclear cataract of left eye  H25.12     1.  Next visit, will study the regional nonperfusion of the left eye via fundus fluorescein angiography.  2.  I explained to the patient that PRP locally to the area of retinal nonperfusion will preserve and protect the remainder of the fundus from neovascular complications in the future 3.  Ophthalmic Meds Ordered this visit:  No orders of the defined types were placed in this encounter.      Return in about 3 weeks (around 09/02/2019) for DILATE OU, OPTOS FFA L/R, PRP, OS.  There are no Patient Instructions on file for this visit.   Explained the diagnoses, plan, and follow up with the patient and they expressed understanding.  Patient expressed understanding of the importance of proper follow up care.   Kristen Potter  M.D. Diseases & Surgery of the Retina and Vitreous [facility] 08/12/19     Abbreviations: M myopia (nearsighted); A astigmatism; H hyperopia (farsighted); P presbyopia; Mrx spectacle prescription;  CTL contact lenses; OD  right eye; OS left eye; OU both eyes  XT exotropia; ET esotropia; PEK punctate epithelial keratitis; PEE punctate epithelial erosions; DES dry eye syndrome; MGD meibomian gland dysfunction; ATs artificial tears; PFAT's preservative free artificial tears; [place] nuclear sclerotic cataract; PSC posterior subcapsular cataract; ERM epi-retinal membrane; PVD posterior vitreous detachment; RD retinal detachment; DM diabetes mellitus; DR diabetic retinopathy; NPDR non-proliferative diabetic retinopathy; PDR proliferative diabetic retinopathy; CSME clinically significant macular edema; DME diabetic macular edema; dbh dot blot hemorrhages; CWS cotton wool spot; POAG primary open angle glaucoma; C/D cup-to-disc ratio; HVF humphrey visual field; GVF goldmann visual field; OCT optical coherence tomography; IOP intraocular pressure; BRVO Branch retinal vein occlusion; CRVO central retinal vein occlusion; CRAO central retinal artery occlusion; BRAO branch retinal artery occlusion; RT retinal tear; SB scleral buckle; PPV pars plana vitrectomy; VH Vitreous hemorrhage; PRP panretinal laser photocoagulation; IVK intravitreal kenalog; VMT vitreomacular traction; MH Macular hole;  NVD neovascularization of the disc; NVE neovascularization elsewhere; AREDS age related eye disease study; ARMD age related macular degeneration; POAG primary open angle glaucoma; EBMD epithelial/anterior basement membrane dystrophy; ACIOL anterior chamber intraocular lens; IOL intraocular lens; PCIOL posterior chamber intraocular lens; Phaco/IOL phacoemulsification with intraocular lens placement; [place] photorefractive keratectomy; LASIK laser assisted in situ keratomileusis; HTN hypertension; DM diabetes mellitus; COPD  chronic obstructive pulmonary disease

## 2019-09-01 ENCOUNTER — Encounter (INDEPENDENT_AMBULATORY_CARE_PROVIDER_SITE_OTHER): Payer: PPO | Admitting: Ophthalmology

## 2019-10-20 DIAGNOSIS — E139 Other specified diabetes mellitus without complications: Secondary | ICD-10-CM | POA: Diagnosis not present

## 2019-10-20 DIAGNOSIS — E7800 Pure hypercholesterolemia, unspecified: Secondary | ICD-10-CM | POA: Diagnosis not present

## 2019-10-20 DIAGNOSIS — E1169 Type 2 diabetes mellitus with other specified complication: Secondary | ICD-10-CM | POA: Diagnosis not present

## 2019-10-20 DIAGNOSIS — E119 Type 2 diabetes mellitus without complications: Secondary | ICD-10-CM | POA: Diagnosis not present

## 2019-10-20 DIAGNOSIS — I10 Essential (primary) hypertension: Secondary | ICD-10-CM | POA: Diagnosis not present

## 2019-10-20 DIAGNOSIS — Z7984 Long term (current) use of oral hypoglycemic drugs: Secondary | ICD-10-CM | POA: Diagnosis not present

## 2019-10-20 DIAGNOSIS — M810 Age-related osteoporosis without current pathological fracture: Secondary | ICD-10-CM | POA: Diagnosis not present

## 2019-10-20 DIAGNOSIS — E782 Mixed hyperlipidemia: Secondary | ICD-10-CM | POA: Diagnosis not present

## 2019-12-16 DIAGNOSIS — I10 Essential (primary) hypertension: Secondary | ICD-10-CM | POA: Diagnosis not present

## 2019-12-16 DIAGNOSIS — E7800 Pure hypercholesterolemia, unspecified: Secondary | ICD-10-CM | POA: Diagnosis not present

## 2019-12-16 DIAGNOSIS — E782 Mixed hyperlipidemia: Secondary | ICD-10-CM | POA: Diagnosis not present

## 2019-12-16 DIAGNOSIS — E119 Type 2 diabetes mellitus without complications: Secondary | ICD-10-CM | POA: Diagnosis not present

## 2019-12-16 DIAGNOSIS — E1169 Type 2 diabetes mellitus with other specified complication: Secondary | ICD-10-CM | POA: Diagnosis not present

## 2019-12-16 DIAGNOSIS — M810 Age-related osteoporosis without current pathological fracture: Secondary | ICD-10-CM | POA: Diagnosis not present

## 2020-01-26 DIAGNOSIS — Z7984 Long term (current) use of oral hypoglycemic drugs: Secondary | ICD-10-CM | POA: Diagnosis not present

## 2020-01-26 DIAGNOSIS — E7800 Pure hypercholesterolemia, unspecified: Secondary | ICD-10-CM | POA: Diagnosis not present

## 2020-01-26 DIAGNOSIS — Z0000 Encounter for general adult medical examination without abnormal findings: Secondary | ICD-10-CM | POA: Diagnosis not present

## 2020-01-26 DIAGNOSIS — M810 Age-related osteoporosis without current pathological fracture: Secondary | ICD-10-CM | POA: Diagnosis not present

## 2020-01-26 DIAGNOSIS — I10 Essential (primary) hypertension: Secondary | ICD-10-CM | POA: Diagnosis not present

## 2020-01-26 DIAGNOSIS — E1169 Type 2 diabetes mellitus with other specified complication: Secondary | ICD-10-CM | POA: Diagnosis not present

## 2020-01-26 DIAGNOSIS — Z23 Encounter for immunization: Secondary | ICD-10-CM | POA: Diagnosis not present

## 2020-02-10 DIAGNOSIS — Z1231 Encounter for screening mammogram for malignant neoplasm of breast: Secondary | ICD-10-CM | POA: Diagnosis not present

## 2020-02-26 DIAGNOSIS — H524 Presbyopia: Secondary | ICD-10-CM | POA: Diagnosis not present

## 2020-02-26 DIAGNOSIS — H52203 Unspecified astigmatism, bilateral: Secondary | ICD-10-CM | POA: Diagnosis not present

## 2020-02-26 DIAGNOSIS — E1136 Type 2 diabetes mellitus with diabetic cataract: Secondary | ICD-10-CM | POA: Diagnosis not present

## 2020-02-26 DIAGNOSIS — H25813 Combined forms of age-related cataract, bilateral: Secondary | ICD-10-CM | POA: Diagnosis not present

## 2020-02-26 DIAGNOSIS — H5203 Hypermetropia, bilateral: Secondary | ICD-10-CM | POA: Diagnosis not present

## 2020-03-22 DIAGNOSIS — E1169 Type 2 diabetes mellitus with other specified complication: Secondary | ICD-10-CM | POA: Diagnosis not present

## 2020-03-22 DIAGNOSIS — E119 Type 2 diabetes mellitus without complications: Secondary | ICD-10-CM | POA: Diagnosis not present

## 2020-03-22 DIAGNOSIS — E7800 Pure hypercholesterolemia, unspecified: Secondary | ICD-10-CM | POA: Diagnosis not present

## 2020-03-22 DIAGNOSIS — E782 Mixed hyperlipidemia: Secondary | ICD-10-CM | POA: Diagnosis not present

## 2020-03-22 DIAGNOSIS — E139 Other specified diabetes mellitus without complications: Secondary | ICD-10-CM | POA: Diagnosis not present

## 2020-03-22 DIAGNOSIS — M810 Age-related osteoporosis without current pathological fracture: Secondary | ICD-10-CM | POA: Diagnosis not present

## 2020-03-22 DIAGNOSIS — I10 Essential (primary) hypertension: Secondary | ICD-10-CM | POA: Diagnosis not present

## 2020-03-31 DIAGNOSIS — H25811 Combined forms of age-related cataract, right eye: Secondary | ICD-10-CM | POA: Diagnosis not present

## 2020-03-31 DIAGNOSIS — H2512 Age-related nuclear cataract, left eye: Secondary | ICD-10-CM | POA: Diagnosis not present

## 2020-03-31 DIAGNOSIS — H25012 Cortical age-related cataract, left eye: Secondary | ICD-10-CM | POA: Diagnosis not present

## 2020-04-14 DIAGNOSIS — H25011 Cortical age-related cataract, right eye: Secondary | ICD-10-CM | POA: Diagnosis not present

## 2020-04-14 DIAGNOSIS — H2511 Age-related nuclear cataract, right eye: Secondary | ICD-10-CM | POA: Diagnosis not present

## 2020-05-13 ENCOUNTER — Encounter (INDEPENDENT_AMBULATORY_CARE_PROVIDER_SITE_OTHER): Payer: Self-pay | Admitting: Ophthalmology

## 2020-05-13 ENCOUNTER — Ambulatory Visit (INDEPENDENT_AMBULATORY_CARE_PROVIDER_SITE_OTHER): Payer: PPO | Admitting: Ophthalmology

## 2020-05-13 ENCOUNTER — Other Ambulatory Visit: Payer: Self-pay

## 2020-05-13 ENCOUNTER — Encounter (INDEPENDENT_AMBULATORY_CARE_PROVIDER_SITE_OTHER): Payer: PPO | Admitting: Ophthalmology

## 2020-05-13 DIAGNOSIS — Z961 Presence of intraocular lens: Secondary | ICD-10-CM

## 2020-05-13 DIAGNOSIS — H348122 Central retinal vein occlusion, left eye, stable: Secondary | ICD-10-CM

## 2020-05-13 DIAGNOSIS — H35352 Cystoid macular degeneration, left eye: Secondary | ICD-10-CM

## 2020-05-13 HISTORY — DX: Presence of intraocular lens: Z96.1

## 2020-05-13 MED ORDER — KETOROLAC TROMETHAMINE 0.5 % OP SOLN: 1.0000 [drp] | Freq: Four times a day (QID) | OPHTHALMIC | 0 refills | Status: DC

## 2020-05-13 NOTE — Assessment & Plan Note (Signed)
Uncomplicated, centered lenses

## 2020-05-13 NOTE — Progress Notes (Signed)
05/13/2020     CHIEF COMPLAINT Patient presents for Retina Evaluation (Refer back for CME OS s/p CEIOL//Pt c/o blurry VA at distance and near OS x 1 month approx. Pt denies ocular pain. OD VA is "great" per pt. )   HISTORY OF PRESENT ILLNESS: Kristen Potter is a 70 y.o. female who presents to the clinic today for:   HPI    Retina Evaluation    In left eye.  This started 1 month ago.  Duration of 1 month.  Context:  distance vision and near vision.  Response to treatment was no improvement. Additional comments: Refer back for CME OS s/p CEIOL  Pt c/o blurry VA at distance and near OS x 1 month approx. Pt denies ocular pain. OD VA is "great" per pt.        Last edited by Ileana Roup, COA on 05/13/2020  8:30 AM. (History)      Referring physician: No referring provider defined for this encounter.  HISTORICAL INFORMATION:   Selected notes from the MEDICAL RECORD NUMBER       CURRENT MEDICATIONS: No current outpatient medications on file. (Ophthalmic Drugs)   No current facility-administered medications for this visit. (Ophthalmic Drugs)   Current Outpatient Medications (Other)  Medication Sig  . amoxicillin (AMOXIL) 500 MG capsule Take 2 capsules (1,000 mg total) by mouth 2 (two) times daily.  Marland Kitchen aspirin 81 MG tablet Take 81 mg by mouth daily. (Patient not taking: Reported on 08/12/2019)  . Calcium Carb-Cholecalciferol (CALCIUM 600 + D PO) Take by mouth.  Marland Kitchen glipiZIDE (GLUCOTROL XL) 2.5 MG 24 hr tablet Take 2.5 mg by mouth daily with breakfast.  . hydrochlorothiazide (HYDRODIURIL) 25 MG tablet Take 25 mg by mouth daily.  Marland Kitchen lisinopril (PRINIVIL,ZESTRIL) 20 MG tablet Take 20 mg by mouth daily.  . metFORMIN (GLUCOPHAGE) 500 MG tablet Take by mouth 2 (two) times daily with a meal.  . simvastatin (ZOCOR) 40 MG tablet Take 40 mg by mouth daily.   No current facility-administered medications for this visit. (Other)      REVIEW OF SYSTEMS:    ALLERGIES Allergies   Allergen Reactions  . Aspirin Nausea And Vomiting  . Latex     PAST MEDICAL HISTORY Past Medical History:  Diagnosis Date  . Age-related nuclear cataract of left eye 08/12/2019  . Age-related nuclear cataract of right eye 08/12/2019  . Cataract   . Diabetes mellitus without complication (HCC)   . Hematuria   . Hyperlipidemia   . Hypertension   . Osteopenia    Past Surgical History:  Procedure Laterality Date  . FOOT SURGERY Right   . VAGINAL HYSTERECTOMY      FAMILY HISTORY History reviewed. No pertinent family history.  SOCIAL HISTORY Social History   Tobacco Use  . Smoking status: Former Games developer  . Smokeless tobacco: Never Used  Substance Use Topics  . Alcohol use: No         OPHTHALMIC EXAM: Base Eye Exam    Visual Acuity (ETDRS)      Right Left   Dist [place] 20/20 20/60 -1   Dist ph [place]  NI       Tonometry (Tonopen, 8:31 AM)      Right Left   Pressure 21 20       Pupils      Pupils Dark Light Shape React APD   Right PERRL 4 3 Round Brisk None   Left PERRL 4 3 Round Brisk None  Visual Fields (Counting fingers)      Left Right    Full Full       Extraocular Movement      Right Left    Full Full       Neuro/Psych    Oriented x3: Yes   Mood/Affect: Normal       Dilation    Both eyes: 1.0% Mydriacyl, 2.5% Phenylephrine @ 8:35 AM        Slit Lamp and Fundus Exam    External Exam      Right Left   External Normal Normal       Slit Lamp Exam      Right Left   Lids/Lashes Normal Normal   Conjunctiva/Sclera White and quiet White and quiet   Cornea Clear Clear   Anterior Chamber Deep and quiet Deep and quiet   Iris Round and reactive Round and reactive   Anterior Vitreous Normal Normal       Fundus Exam      Right Left   Posterior Vitreous Normal Normal   Disc Normal Collaterals   C/D Ratio  0.55   Macula Normal Microaneurysms, Exudates, Cystoid macular edema   Vessels Normal Old Hemi-CRVO ,,   Periphery Normal Regional  white lines of nonperfusion distal inferotemporal venous arcade, retinal nonperfusion          IMAGING AND PROCEDURES  Imaging and Procedures for 05/13/20  OCT, Retina - OU - Both Eyes       Right Eye Quality was good. Scan locations included subfoveal. Central Foveal Thickness: 254. Progression has been stable.   Left Eye Quality was good. Scan locations included subfoveal. Central Foveal Thickness: 443. Progression has worsened.   Notes Pseudophakic cystoid macular edema left eye apparent, along the inferotemporal portion of the macula yet there is no edema along the inferotemporal retinal vascular arcade this this is not likely a recurrence of branch retinal vein occlusion or Hemi central retinal vein occlusion left eye which was present in the past.  Therefore we will treat this as pseudophakic CME initially                ASSESSMENT/PLAN:  Pseudophakia of both eyes Uncomplicated, centered lenses  Cystoid macular edema of left eye Postop OS appears to be pseudophakic CME as there is no current accompanying engorgement of retinal veins in the inferotemporal quadrant nonetheless this is an eye with a prior  Hemi central retinal vein occlusion which had resolved  Central retinal vein occlusion of left eye No signs of recurrence on clinical examination      ICD-10-CM   1. Central retinal vein occlusion of left eye, unspecified complication status  H34.8122 OCT, Retina - OU - Both Eyes  2. Pseudophakia of both eyes  Z96.1   3. Cystoid macular edema of left eye  H35.352     1.  See comments above regarding cystoid macular edema left eye.  We will treat as pseudophakic CME initially with topical NSAID 1 drop left eye 4 times daily thereafter we may very well treat if CME worsens this could in fact be early BRVO recurring  2.  3.  Ophthalmic Meds Ordered this visit:  No orders of the defined types were placed in this encounter.      Return in about 4 weeks  (around 06/10/2020) for dilate, OS, OCT.  There are no Patient Instructions on file for this visit.   Explained the diagnoses, plan, and follow up with the patient and  they expressed understanding.  Patient expressed understanding of the importance of proper follow up care.   Alford Highland Heddy Vidana M.D. Diseases & Surgery of the Retina and Vitreous [facility] 05/13/20     Abbreviations: M myopia (nearsighted); A astigmatism; H hyperopia (farsighted); P presbyopia; Mrx spectacle prescription;  CTL contact lenses; OD right eye; OS left eye; OU both eyes  XT exotropia; ET esotropia; PEK punctate epithelial keratitis; PEE punctate epithelial erosions; DES dry eye syndrome; MGD meibomian gland dysfunction; ATs artificial tears; PFAT's preservative free artificial tears; NSC nuclear sclerotic cataract; PSC posterior subcapsular cataract; ERM epi-retinal membrane; PVD posterior vitreous detachment; RD retinal detachment; DM diabetes mellitus; DR diabetic retinopathy; NPDR non-proliferative diabetic retinopathy; PDR proliferative diabetic retinopathy; CSME clinically significant macular edema; DME diabetic macular edema; dbh dot blot hemorrhages; CWS cotton wool spot; POAG primary open angle glaucoma; C/D cup-to-disc ratio; HVF humphrey visual field; GVF goldmann visual field; OCT optical coherence tomography; IOP intraocular pressure; BRVO Branch retinal vein occlusion; CRVO central retinal vein occlusion; CRAO central retinal artery occlusion; BRAO branch retinal artery occlusion; RT retinal tear; SB scleral buckle; PPV pars plana vitrectomy; VH Vitreous hemorrhage; PRP panretinal laser photocoagulation; IVK intravitreal kenalog; VMT vitreomacular traction; MH Macular hole;  NVD neovascularization of the disc; NVE neovascularization elsewhere; AREDS age related eye disease study; ARMD age related macular degeneration; POAG primary open angle glaucoma; EBMD epithelial/anterior basement membrane  dystrophy; ACIOL anterior chamber intraocular lens; IOL intraocular lens; PCIOL posterior chamber intraocular lens; Phaco/IOL phacoemulsification with intraocular lens placement; PRK photorefractive keratectomy; LASIK laser assisted in situ keratomileusis; HTN hypertension; DM diabetes mellitus; COPD chronic obstructive pulmonary disease

## 2020-05-13 NOTE — Patient Instructions (Signed)
Commence topical therapy 1 drop left eye 4 times daily of ketorolac

## 2020-05-13 NOTE — Assessment & Plan Note (Signed)
No signs of recurrence on clinical examination

## 2020-05-13 NOTE — Assessment & Plan Note (Addendum)
Postop OS appears to be pseudophakic CME as there is no current accompanying engorgement of retinal veins in the inferotemporal quadrant nonetheless this is an eye with a prior  Hemi central retinal vein occlusion which had resolved  We will treat with topical medications, NSAIDs 1 drop left eye 4 times daily

## 2020-06-09 ENCOUNTER — Encounter (INDEPENDENT_AMBULATORY_CARE_PROVIDER_SITE_OTHER): Payer: PPO | Admitting: Ophthalmology

## 2020-06-22 ENCOUNTER — Encounter (INDEPENDENT_AMBULATORY_CARE_PROVIDER_SITE_OTHER): Payer: PPO | Admitting: Ophthalmology

## 2020-06-23 ENCOUNTER — Encounter (INDEPENDENT_AMBULATORY_CARE_PROVIDER_SITE_OTHER): Payer: Self-pay | Admitting: Ophthalmology

## 2020-06-23 ENCOUNTER — Other Ambulatory Visit: Payer: Self-pay

## 2020-06-23 ENCOUNTER — Ambulatory Visit (INDEPENDENT_AMBULATORY_CARE_PROVIDER_SITE_OTHER): Payer: PPO | Admitting: Ophthalmology

## 2020-06-23 DIAGNOSIS — H35352 Cystoid macular degeneration, left eye: Secondary | ICD-10-CM | POA: Diagnosis not present

## 2020-06-23 DIAGNOSIS — H348122 Central retinal vein occlusion, left eye, stable: Secondary | ICD-10-CM | POA: Diagnosis not present

## 2020-06-23 MED ORDER — KETOROLAC TROMETHAMINE 0.5 % OP SOLN: 1.0000 [drp] | Freq: Four times a day (QID) | OPHTHALMIC | 4 refills | Status: DC

## 2020-06-23 NOTE — Progress Notes (Signed)
06/23/2020     CHIEF COMPLAINT Patient presents for Retina Follow Up (4 week fu OS/OCT OS/Pt states, "My va in my os is getting better but it still has a little fuzzy area. I just ran out of my gtts yesterday.")   HISTORY OF PRESENT ILLNESS: Kristen Potter is a 70 y.o. female who presents to the clinic today for:   HPI    Retina Follow Up    Diagnosis: CRVO/BRVO   Laterality: left eye   Onset: 4 weeks ago   Severity: mild   Duration: 4 weeks   Course: gradually improving   Comments: 4 week fu OS/OCT OS Pt states, "My va in my os is getting better but it still has a little fuzzy area. I just ran out of my gtts yesterday."       Last edited by Demetrios Loll, COA on 06/23/2020  1:10 PM. (History)      Referring physician: No referring provider defined for this encounter.  HISTORICAL INFORMATION:   Selected notes from the MEDICAL RECORD NUMBER       CURRENT MEDICATIONS: Current Outpatient Medications (Ophthalmic Drugs)  Medication Sig  . ketorolac (ACULAR) 0.5 % ophthalmic solution Place 1 drop into the left eye 4 (four) times daily.   No current facility-administered medications for this visit. (Ophthalmic Drugs)   Current Outpatient Medications (Other)  Medication Sig  . amoxicillin (AMOXIL) 500 MG capsule Take 2 capsules (1,000 mg total) by mouth 2 (two) times daily.  Marland Kitchen aspirin 81 MG tablet Take 81 mg by mouth daily. (Patient not taking: Reported on 08/12/2019)  . Calcium Carb-Cholecalciferol (CALCIUM 600 + D PO) Take by mouth.  Marland Kitchen glipiZIDE (GLUCOTROL XL) 2.5 MG 24 hr tablet Take 2.5 mg by mouth daily with breakfast.  . hydrochlorothiazide (HYDRODIURIL) 25 MG tablet Take 25 mg by mouth daily.  Marland Kitchen lisinopril (PRINIVIL,ZESTRIL) 20 MG tablet Take 20 mg by mouth daily.  . metFORMIN (GLUCOPHAGE) 500 MG tablet Take by mouth 2 (two) times daily with a meal.  . simvastatin (ZOCOR) 40 MG tablet Take 40 mg by mouth daily.   No current facility-administered medications for this  visit. (Other)      REVIEW OF SYSTEMS:    ALLERGIES Allergies  Allergen Reactions  . Aspirin Nausea And Vomiting  . Latex     PAST MEDICAL HISTORY Past Medical History:  Diagnosis Date  . Age-related nuclear cataract of left eye 08/12/2019  . Age-related nuclear cataract of right eye 08/12/2019  . Cataract   . Diabetes mellitus without complication (HCC)   . Hematuria   . Hyperlipidemia   . Hypertension   . Osteopenia    Past Surgical History:  Procedure Laterality Date  . FOOT SURGERY Right   . VAGINAL HYSTERECTOMY      FAMILY HISTORY History reviewed. No pertinent family history.  SOCIAL HISTORY Social History   Tobacco Use  . Smoking status: Former Games developer  . Smokeless tobacco: Never Used  Substance Use Topics  . Alcohol use: No         OPHTHALMIC EXAM: Base Eye Exam    Visual Acuity (ETDRS)      Right Left   Dist [place] 20/25 -1 20/30 +2   Dist ph [place]  NI       Tonometry (Tonopen, 1:15 PM)      Right Left   Pressure 15 14       Pupils      Pupils Dark Light Shape React APD  Right PERRL 4 3 Round Brisk None   Left PERRL 4 3 Round Brisk None       Visual Fields (Counting fingers)      Left Right    Full Full       Extraocular Movement      Right Left    Full Full       Neuro/Psych    Oriented x3: Yes   Mood/Affect: Normal       Dilation    Left eye: 1.0% Mydriacyl, 2.5% Phenylephrine @ 1:15 PM        Slit Lamp and Fundus Exam    External Exam      Right Left   External Normal Normal       Slit Lamp Exam      Right Left   Lids/Lashes Normal Normal   Conjunctiva/Sclera White and quiet White and quiet   Cornea Clear Clear   Anterior Chamber Deep and quiet Deep and quiet   Iris Round and reactive Round and reactive   Anterior Vitreous Normal Normal       Fundus Exam      Right Left   Posterior Vitreous Normal Normal   Disc Normal Collaterals   C/D Ratio  0.55   Macula Normal Microaneurysms, Exudates, Cystoid macular  edema   Vessels Normal Old Hemi-CRVO ,,   Periphery Normal Regional white lines of nonperfusion distal inferotemporal venous arcade, retinal nonperfusion          IMAGING AND PROCEDURES  Imaging and Procedures for 06/23/20  OCT, Retina - OU - Both Eyes       Right Eye Quality was good. Scan locations included subfoveal. Central Foveal Thickness: 263. Progression has been stable.   Left Eye Quality was good. Scan locations included subfoveal. Central Foveal Thickness: 385. Progression has improved. Findings include cystoid macular edema.   Notes Old Hemi central CRV O with CME, yet improved only on topical NSAIDs now for the last 4 weeks thus some component of CME pseudophakic.                ASSESSMENT/PLAN:  Central retinal vein occlusion of left eye Prior Hemi central retinal vein occlusion with residual collateral vessels of microaneurysms temporal to the fovea left eye, with most recent CME however this improved on topical NSAIDs alone thus not likely an exacerbation of the CRV O with CME   Cystoid macular edema of left eye Improved OS on 4 times daily topical NSAID.  Over the last 4 weeks.  Will repeat refill of topical NSAID ketorolac to use 4 times daily for another 8 weeks and reevaluate.      ICD-10-CM   1. Central retinal vein occlusion of left eye, unspecified complication status  H34.8122 OCT, Retina - OU - Both Eyes  2. Cystoid macular edema of left eye  H35.352     1.  CME OS improved on topical therapy alone thus some component of pseudophakic CME OS.  Certainly this is on the basis of exacerbation of prior compensated CRVO.  2.  Refill topical ketorolac 1 drop left eye 4 times daily and follow-up monitor the result in 8 weeks  3.  Ophthalmic Meds Ordered this visit:  No orders of the defined types were placed in this encounter.      Return in about 8 weeks (around 08/18/2020) for dilate, OS, OCT.  There are no Patient Instructions on file  for this visit.   Explained the diagnoses, plan, and follow  up with the patient and they expressed understanding.  Patient expressed understanding of the importance of proper follow up care.   Alford Highland Tierria Watson M.D. Diseases & Surgery of the Retina and Vitreous [facility] 06/23/20     Abbreviations: M myopia (nearsighted); A astigmatism; H hyperopia (farsighted); P presbyopia; Mrx spectacle prescription;  CTL contact lenses; OD right eye; OS left eye; OU both eyes  XT exotropia; ET esotropia; PEK punctate epithelial keratitis; PEE punctate epithelial erosions; DES dry eye syndrome; MGD meibomian gland dysfunction; ATs artificial tears; PFAT's preservative free artificial tears; NSC nuclear sclerotic cataract; PSC posterior subcapsular cataract; ERM epi-retinal membrane; PVD posterior vitreous detachment; RD retinal detachment; DM diabetes mellitus; DR diabetic retinopathy; NPDR non-proliferative diabetic retinopathy; PDR proliferative diabetic retinopathy; CSME clinically significant macular edema; DME diabetic macular edema; dbh dot blot hemorrhages; CWS cotton wool spot; POAG primary open angle glaucoma; C/D cup-to-disc ratio; HVF humphrey visual field; GVF goldmann visual field; OCT optical coherence tomography; IOP intraocular pressure; BRVO Branch retinal vein occlusion; CRVO central retinal vein occlusion; CRAO central retinal artery occlusion; BRAO branch retinal artery occlusion; RT retinal tear; SB scleral buckle; PPV pars plana vitrectomy; VH Vitreous hemorrhage; PRP panretinal laser photocoagulation; IVK intravitreal kenalog; VMT vitreomacular traction; MH Macular hole;  NVD neovascularization of the disc; NVE neovascularization elsewhere; AREDS age related eye disease study; ARMD age related macular degeneration; POAG primary open angle glaucoma; EBMD epithelial/anterior basement membrane dystrophy; ACIOL anterior chamber intraocular lens; IOL intraocular lens; PCIOL  posterior chamber intraocular lens; Phaco/IOL phacoemulsification with intraocular lens placement; PRK photorefractive keratectomy; LASIK laser assisted in situ keratomileusis; HTN hypertension; DM diabetes mellitus; COPD chronic obstructive pulmonary disease

## 2020-06-23 NOTE — Assessment & Plan Note (Signed)
Improved OS on 4 times daily topical NSAID.  Over the last 4 weeks.  Will repeat refill of topical NSAID ketorolac to use 4 times daily for another 8 weeks and reevaluate.

## 2020-06-23 NOTE — Assessment & Plan Note (Signed)
Prior Hemi central retinal vein occlusion with residual collateral vessels of microaneurysms temporal to the fovea left eye, with most recent CME however this improved on topical NSAIDs alone thus not likely an exacerbation of the CRV O with CME

## 2020-06-25 DIAGNOSIS — I10 Essential (primary) hypertension: Secondary | ICD-10-CM | POA: Diagnosis not present

## 2020-06-25 DIAGNOSIS — E1169 Type 2 diabetes mellitus with other specified complication: Secondary | ICD-10-CM | POA: Diagnosis not present

## 2020-06-25 DIAGNOSIS — M810 Age-related osteoporosis without current pathological fracture: Secondary | ICD-10-CM | POA: Diagnosis not present

## 2020-06-25 DIAGNOSIS — Z7984 Long term (current) use of oral hypoglycemic drugs: Secondary | ICD-10-CM | POA: Diagnosis not present

## 2020-06-25 DIAGNOSIS — E7800 Pure hypercholesterolemia, unspecified: Secondary | ICD-10-CM | POA: Diagnosis not present

## 2020-08-19 ENCOUNTER — Encounter (INDEPENDENT_AMBULATORY_CARE_PROVIDER_SITE_OTHER): Payer: Self-pay | Admitting: Ophthalmology

## 2020-08-19 ENCOUNTER — Other Ambulatory Visit: Payer: Self-pay

## 2020-08-19 ENCOUNTER — Ambulatory Visit (INDEPENDENT_AMBULATORY_CARE_PROVIDER_SITE_OTHER): Payer: PPO | Admitting: Ophthalmology

## 2020-08-19 DIAGNOSIS — H35352 Cystoid macular degeneration, left eye: Secondary | ICD-10-CM | POA: Diagnosis not present

## 2020-08-19 DIAGNOSIS — H348122 Central retinal vein occlusion, left eye, stable: Secondary | ICD-10-CM | POA: Diagnosis not present

## 2020-08-19 NOTE — Progress Notes (Signed)
08/19/2020     CHIEF COMPLAINT Patient presents for Retina Follow Up (8 week fu os and OCT/Pt states, "I feel like my va is gradually improving."/Pt reports using Ketorolac QID OS/A1C: 6.7/LBS: 120/)   HISTORY OF PRESENT ILLNESS: Kristen Potter is a 70 y.o. female who presents to the clinic today for:   HPI     Retina Follow Up           Diagnosis: CRVO/BRVO   Laterality: left eye   Onset: 8 weeks ago   Severity: mild   Duration: 8 weeks   Course: gradually improving   Comments: 8 week fu os and OCT Pt states, "I feel like my va is gradually improving." Pt reports using Ketorolac QID OS A1C: 6.7 LBS: 120        Last edited by Demetrios Loll, COA on 08/19/2020  1:32 PM.      Referring physician: No referring provider defined for this encounter.  HISTORICAL INFORMATION:   Selected notes from the MEDICAL RECORD NUMBER       CURRENT MEDICATIONS: No current outpatient medications on file. (Ophthalmic Drugs)   No current facility-administered medications for this visit. (Ophthalmic Drugs)   Current Outpatient Medications (Other)  Medication Sig   amoxicillin (AMOXIL) 500 MG capsule Take 2 capsules (1,000 mg total) by mouth 2 (two) times daily.   aspirin 81 MG tablet Take 81 mg by mouth daily. (Patient not taking: Reported on 08/12/2019)   Calcium Carb-Cholecalciferol (CALCIUM 600 + D PO) Take by mouth.   glipiZIDE (GLUCOTROL XL) 2.5 MG 24 hr tablet Take 2.5 mg by mouth daily with breakfast.   hydrochlorothiazide (HYDRODIURIL) 25 MG tablet Take 25 mg by mouth daily.   lisinopril (PRINIVIL,ZESTRIL) 20 MG tablet Take 20 mg by mouth daily.   metFORMIN (GLUCOPHAGE) 500 MG tablet Take by mouth 2 (two) times daily with a meal.   simvastatin (ZOCOR) 40 MG tablet Take 40 mg by mouth daily.   No current facility-administered medications for this visit. (Other)      REVIEW OF SYSTEMS:    ALLERGIES Allergies  Allergen Reactions   Aspirin Nausea And Vomiting    Latex     PAST MEDICAL HISTORY Past Medical History:  Diagnosis Date   Age-related nuclear cataract of left eye 08/12/2019   Age-related nuclear cataract of right eye 08/12/2019   Cataract    Diabetes mellitus without complication (HCC)    Hematuria    Hyperlipidemia    Hypertension    Osteopenia    Past Surgical History:  Procedure Laterality Date   FOOT SURGERY Right    VAGINAL HYSTERECTOMY      FAMILY HISTORY History reviewed. No pertinent family history.  SOCIAL HISTORY Social History   Tobacco Use   Smoking status: Former    Pack years: 0.00   Smokeless tobacco: Never  Substance Use Topics   Alcohol use: No         OPHTHALMIC EXAM:  Base Eye Exam     Visual Acuity (ETDRS)       Right Left   Dist  20/20 20/20 -1         Tonometry (Tonopen, 1:37 PM)       Right Left   Pressure 15 15         Pupils       Pupils Dark Light Shape React APD   Right PERRL 4 3 Round Brisk None   Left PERRL 4 3 Round Brisk None  Visual Fields (Counting fingers)       Left Right    Full Full         Extraocular Movement       Right Left    Full Full         Neuro/Psych     Oriented x3: Yes   Mood/Affect: Normal         Dilation     Left eye: 1.0% Mydriacyl, 2.5% Phenylephrine @ 1:37 PM           Slit Lamp and Fundus Exam     External Exam       Right Left   External Normal Normal         Slit Lamp Exam       Right Left   Lids/Lashes Normal Normal   Conjunctiva/Sclera White and quiet White and quiet   Cornea Clear Clear   Anterior Chamber Deep and quiet Deep and quiet   Iris Round and reactive Round and reactive   Lens Centered posterior chamber intraocular lens Centered posterior chamber intraocular lens   Anterior Vitreous Normal Normal         Fundus Exam       Right Left   Posterior Vitreous  Normal, no cells   Disc  Collaterals   C/D Ratio  0.55   Macula  Microaneurysms, Exudates, Cystoid macular  edema no longer center involved   Vessels  Old Hemi-CRVO ,,   Periphery  Regional white lines of nonperfusion distal inferotemporal venous arcade, retinal nonperfusion            IMAGING AND PROCEDURES  Imaging and Procedures for 08/19/20  OCT, Retina - OU - Both Eyes       Right Eye Quality was good. Scan locations included subfoveal. Central Foveal Thickness: 261. Progression has been stable.   Left Eye Quality was good. Scan locations included subfoveal. Central Foveal Thickness: 308. Progression has improved. Findings include cystoid macular edema.   Notes Old Hemi central CRV O with CME,, and much less center involvement from apparent collateralization from [facility] central retinal vein occlusion.             ASSESSMENT/PLAN:  Central retinal vein occlusion of left eye Much less active CME less center involved, likely due to collateralization of the area temporal to the fovea left eye.  Cystoid macular edema of left eye Much less center involved CME.  We will actually asked patient to discontinue topical NSAIDs.     ICD-10-CM   1. Central retinal vein occlusion of left eye, unspecified complication status  H34.8122 OCT, Retina - OU - Both Eyes    2. Cystoid macular edema of left eye  H35.352 OCT, Retina - OU - Both Eyes      1.  Hemi central retinal vein occlusion inferotemporal portion of the macula with CME.  Spontaneous improvement continues since onset discovered March 2022.  We will discontinue topical NSAID.  2.  Follow-up in 3 months dilate OU  3.  Ophthalmic Meds Ordered this visit:  No orders of the defined types were placed in this encounter.      Return in about 3 months (around 11/19/2020) for OCT, COLOR FP, DILATE OU.  There are no Patient Instructions on file for this visit.   Explained the diagnoses, plan, and follow up with the patient and they expressed understanding.  Patient expressed understanding of the importance of proper follow  up care.   Alford Highland Hanley Woerner M.D. Diseases &  Surgery of the Retina and Vitreous [facility] 08/19/20     Abbreviations: M myopia (nearsighted); A astigmatism; H hyperopia (farsighted); P presbyopia; Mrx spectacle prescription;  CTL contact lenses; OD right eye; OS left eye; OU both eyes  XT exotropia; ET esotropia; PEK punctate epithelial keratitis; PEE punctate epithelial erosions; DES dry eye syndrome; MGD meibomian gland dysfunction; ATs artificial tears; PFAT's preservative free artificial tears; NSC nuclear sclerotic cataract; PSC posterior subcapsular cataract; ERM epi-retinal membrane; PVD posterior vitreous detachment; RD retinal detachment; DM diabetes mellitus; DR diabetic retinopathy; NPDR non-proliferative diabetic retinopathy; PDR proliferative diabetic retinopathy; CSME clinically significant macular edema; DME diabetic macular edema; dbh dot blot hemorrhages; CWS cotton wool spot; POAG primary open angle glaucoma; C/D cup-to-disc ratio; HVF humphrey visual field; GVF goldmann visual field; OCT optical coherence tomography; IOP intraocular pressure; BRVO Branch retinal vein occlusion; CRVO central retinal vein occlusion; CRAO central retinal artery occlusion; BRAO branch retinal artery occlusion; RT retinal tear; SB scleral buckle; PPV pars plana vitrectomy; VH Vitreous hemorrhage; PRP panretinal laser photocoagulation; IVK intravitreal kenalog; VMT vitreomacular traction; MH Macular hole;  NVD neovascularization of the disc; NVE neovascularization elsewhere; AREDS age related eye disease study; ARMD age related macular degeneration; POAG primary open angle glaucoma; EBMD epithelial/anterior basement membrane dystrophy; ACIOL anterior chamber intraocular lens; IOL intraocular lens; PCIOL posterior chamber intraocular lens; Phaco/IOL phacoemulsification with intraocular lens placement; PRK photorefractive keratectomy; LASIK laser assisted in situ keratomileusis; HTN  hypertension; DM diabetes mellitus; COPD chronic obstructive pulmonary disease

## 2020-08-19 NOTE — Assessment & Plan Note (Signed)
Much less active CME less center involved, likely due to collateralization of the area temporal to the fovea left eye.

## 2020-08-19 NOTE — Assessment & Plan Note (Signed)
Much less center involved CME.  We will actually asked patient to discontinue topical NSAIDs.

## 2020-08-31 DIAGNOSIS — Z1211 Encounter for screening for malignant neoplasm of colon: Secondary | ICD-10-CM | POA: Diagnosis not present

## 2020-08-31 DIAGNOSIS — Z86010 Personal history of colonic polyps: Secondary | ICD-10-CM | POA: Diagnosis not present

## 2020-09-06 DIAGNOSIS — E782 Mixed hyperlipidemia: Secondary | ICD-10-CM | POA: Diagnosis not present

## 2020-09-06 DIAGNOSIS — E119 Type 2 diabetes mellitus without complications: Secondary | ICD-10-CM | POA: Diagnosis not present

## 2020-09-06 DIAGNOSIS — I10 Essential (primary) hypertension: Secondary | ICD-10-CM | POA: Diagnosis not present

## 2020-09-06 DIAGNOSIS — E7800 Pure hypercholesterolemia, unspecified: Secondary | ICD-10-CM | POA: Diagnosis not present

## 2020-09-06 DIAGNOSIS — M810 Age-related osteoporosis without current pathological fracture: Secondary | ICD-10-CM | POA: Diagnosis not present

## 2020-11-25 ENCOUNTER — Encounter (INDEPENDENT_AMBULATORY_CARE_PROVIDER_SITE_OTHER): Payer: PPO | Admitting: Ophthalmology

## 2020-12-01 ENCOUNTER — Other Ambulatory Visit: Payer: Self-pay

## 2020-12-01 ENCOUNTER — Encounter (INDEPENDENT_AMBULATORY_CARE_PROVIDER_SITE_OTHER): Payer: Self-pay | Admitting: Ophthalmology

## 2020-12-01 ENCOUNTER — Ambulatory Visit (INDEPENDENT_AMBULATORY_CARE_PROVIDER_SITE_OTHER): Payer: PPO | Admitting: Ophthalmology

## 2020-12-01 DIAGNOSIS — H35352 Cystoid macular degeneration, left eye: Secondary | ICD-10-CM | POA: Diagnosis not present

## 2020-12-01 DIAGNOSIS — H348122 Central retinal vein occlusion, left eye, stable: Secondary | ICD-10-CM | POA: Diagnosis not present

## 2020-12-01 NOTE — Progress Notes (Signed)
12/01/2020     CHIEF COMPLAINT Patient presents for  Chief Complaint  Patient presents with   Retina Follow Up      HISTORY OF PRESENT ILLNESS: Kristen Potter is a 70 y.o. female who presents to the clinic today for:   HPI     Retina Follow Up   Patient presents with  CRVO/BRVO.  In both eyes.  This started 3 months ago.  Severity is mild.  Duration of 3 months.  Since onset it is stable.        Comments   3 month fu ou and oct and fp Pt states VA OU stable since last visit. Pt denies FOL, floaters, or ocular pain OU.  A1C: unknown LBS:107      Last edited by Demetrios Loll, COA on 12/01/2020 10:18 AM.      Referring physician: Jethro Bolus, MD [address] [place],  [place] [address]  HISTORICAL INFORMATION:   Selected notes from the MEDICAL RECORD NUMBER       CURRENT MEDICATIONS: No current outpatient medications on file. (Ophthalmic Drugs)   No current facility-administered medications for this visit. (Ophthalmic Drugs)   Current Outpatient Medications (Other)  Medication Sig   amoxicillin (AMOXIL) 500 MG capsule Take 2 capsules (1,000 mg total) by mouth 2 (two) times daily.   aspirin 81 MG tablet Take 81 mg by mouth daily. (Patient not taking: Reported on 08/12/2019)   Calcium Carb-Cholecalciferol (CALCIUM 600 + D PO) Take by mouth.   glipiZIDE (GLUCOTROL XL) 2.5 MG 24 hr tablet Take 2.5 mg by mouth daily with breakfast.   hydrochlorothiazide (HYDRODIURIL) 25 MG tablet Take 25 mg by mouth daily.   lisinopril (PRINIVIL,ZESTRIL) 20 MG tablet Take 20 mg by mouth daily.   metFORMIN (GLUCOPHAGE) 500 MG tablet Take by mouth 2 (two) times daily with a meal.   simvastatin (ZOCOR) 40 MG tablet Take 40 mg by mouth daily.   No current facility-administered medications for this visit. (Other)      REVIEW OF SYSTEMS:    ALLERGIES Allergies  Allergen Reactions   Aspirin Nausea And Vomiting   Latex     PAST MEDICAL HISTORY Past Medical  History:  Diagnosis Date   Age-related nuclear cataract of left eye 08/12/2019   Age-related nuclear cataract of right eye 08/12/2019   Cataract    Diabetes mellitus without complication (HCC)    Hematuria    Hyperlipidemia    Hypertension    Osteopenia    Past Surgical History:  Procedure Laterality Date   FOOT SURGERY Right    VAGINAL HYSTERECTOMY      FAMILY HISTORY History reviewed. No pertinent family history.  SOCIAL HISTORY Social History   Tobacco Use   Smoking status: Former   Smokeless tobacco: Never  Substance Use Topics   Alcohol use: No         OPHTHALMIC EXAM:  Base Eye Exam     Visual Acuity (ETDRS)       Right Left   Dist [place] 20/20 20/20         Tonometry (Tonopen, 10:22 AM)       Right Left   Pressure 15 15         Pupils       Pupils Shape React APD   Right PERRL Round Brisk None   Left PERRL Round Brisk None         Visual Fields (Counting fingers)       Left  Right    Full Full         Extraocular Movement       Right Left    Full Full         Neuro/Psych     Oriented x3: Yes   Mood/Affect: Normal         Dilation     Both eyes: 1.0% Mydriacyl, 2.5% Phenylephrine @ 10:22 AM           Slit Lamp and Fundus Exam     External Exam       Right Left   External Normal Normal         Slit Lamp Exam       Right Left   Lids/Lashes Normal Normal   Conjunctiva/Sclera White and quiet White and quiet   Cornea Clear Clear   Anterior Chamber Deep and quiet Deep and quiet   Iris Round and reactive Round and reactive   Lens Centered posterior chamber intraocular lens Centered posterior chamber intraocular lens   Anterior Vitreous Normal Normal         Fundus Exam       Right Left   Posterior Vitreous Normal Normal, no cells   Disc Normal Collaterals   C/D Ratio  0.55   Macula Normal Microaneurysms, Exudates, Cystoid macular edema no longer center involved   Vessels Normal Old Hemi-CRVO ,,    Periphery Normal Regional white lines of nonperfusion distal inferotemporal venous arcade, retinal nonperfusion            IMAGING AND PROCEDURES  Imaging and Procedures for 12/01/20  OCT, Retina - OU - Both Eyes       Right Eye Quality was good. Scan locations included subfoveal. Central Foveal Thickness: 255. Progression has been stable.   Left Eye Quality was good. Scan locations included subfoveal. Central Foveal Thickness: 291. Progression has improved.   Notes Old Hemi central CRV O with no active CME.  OS  We will continue to monitor and observe     Color Fundus Photography Optos - OU - Both Eyes       Right Eye Progression has been stable. Disc findings include normal observations. Macula : normal observations. Vessels : normal observations. Periphery : normal observations.   Left Eye Progression has been stable. Macula : microaneurysms, edema.   Notes     OS; regional white lines of nonperfusion distal inferotemporal venous arcade, retinal nonperfusion, secondary to prior old hemicentral retinal vein occlusion.  Large region of nonperfusion poses risk of neovascularization elsewhere.  We will continue to monitor and observe OS no contributory CME   OS with collaterals on the optic nerve               ASSESSMENT/PLAN:  Central retinal vein occlusion of left eye OS with Hemi central retinal vein occlusion inferotemporal with prior CME which is now resolved spontaneously.  This may be associated with collateralization of vessels at the optic nerve, there is no sign of active NVE peripherally.  Nonetheless we will continue to monitor for signs of NVI and deliver PRP peripherally should it be required      ICD-10-CM   1. Central retinal vein occlusion of left eye, unspecified complication status  H34.8122 OCT, Retina - OU - Both Eyes    Color Fundus Photography Optos - OU - Both Eyes    2. Cystoid macular edema of left eye  H35.352 OCT, Retina - OU -  Both Eyes    Color  Fundus Photography Optos - OU - Both Eyes      1.  OS stable CME resolved from Hemi central vein occlusion.  Notes no neovascular complication we will continue to monitor and observe  2.  3.  Ophthalmic Meds Ordered this visit:  No orders of the defined types were placed in this encounter.      Return in about 4 months (around 04/03/2021) for DILATE OU, COLOR FP, OCT.  There are no Patient Instructions on file for this visit.   Explained the diagnoses, plan, and follow up with the patient and they expressed understanding.  Patient expressed understanding of the importance of proper follow up care.   Alford Highland Khaled Herda M.D. Diseases & Surgery of the Retina and Vitreous [facility] 12/01/20     Abbreviations: M myopia (nearsighted); A astigmatism; H hyperopia (farsighted); P presbyopia; Mrx spectacle prescription;  CTL contact lenses; OD right eye; OS left eye; OU both eyes  XT exotropia; ET esotropia; PEK punctate epithelial keratitis; PEE punctate epithelial erosions; DES dry eye syndrome; MGD meibomian gland dysfunction; ATs artificial tears; PFAT's preservative free artificial tears; NSC nuclear sclerotic cataract; PSC posterior subcapsular cataract; ERM epi-retinal membrane; PVD posterior vitreous detachment; RD retinal detachment; DM diabetes mellitus; DR diabetic retinopathy; NPDR non-proliferative diabetic retinopathy; PDR proliferative diabetic retinopathy; CSME clinically significant macular edema; DME diabetic macular edema; dbh dot blot hemorrhages; CWS cotton wool spot; POAG primary open angle glaucoma; C/D cup-to-disc ratio; HVF humphrey visual field; GVF goldmann visual field; OCT optical coherence tomography; IOP intraocular pressure; BRVO Branch retinal vein occlusion; CRVO central retinal vein occlusion; CRAO central retinal artery occlusion; BRAO branch retinal artery occlusion; RT retinal tear; SB scleral buckle; PPV pars plana  vitrectomy; VH Vitreous hemorrhage; PRP panretinal laser photocoagulation; IVK intravitreal kenalog; VMT vitreomacular traction; MH Macular hole;  NVD neovascularization of the disc; NVE neovascularization elsewhere; AREDS age related eye disease study; ARMD age related macular degeneration; POAG primary open angle glaucoma; EBMD epithelial/anterior basement membrane dystrophy; ACIOL anterior chamber intraocular lens; IOL intraocular lens; PCIOL posterior chamber intraocular lens; Phaco/IOL phacoemulsification with intraocular lens placement; PRK photorefractive keratectomy; LASIK laser assisted in situ keratomileusis; HTN hypertension; DM diabetes mellitus; COPD chronic obstructive pulmonary disease

## 2020-12-01 NOTE — Assessment & Plan Note (Signed)
OS with Hemi central retinal vein occlusion inferotemporal with prior CME which is now resolved spontaneously.  This may be associated with collateralization of vessels at the optic nerve, there is no sign of active NVE peripherally.  Nonetheless we will continue to monitor for signs of NVI and deliver PRP peripherally should it be required

## 2020-12-09 DIAGNOSIS — Z7984 Long term (current) use of oral hypoglycemic drugs: Secondary | ICD-10-CM | POA: Diagnosis not present

## 2020-12-09 DIAGNOSIS — M810 Age-related osteoporosis without current pathological fracture: Secondary | ICD-10-CM | POA: Diagnosis not present

## 2020-12-09 DIAGNOSIS — E1169 Type 2 diabetes mellitus with other specified complication: Secondary | ICD-10-CM | POA: Diagnosis not present

## 2020-12-09 DIAGNOSIS — Z5181 Encounter for therapeutic drug level monitoring: Secondary | ICD-10-CM | POA: Diagnosis not present

## 2020-12-09 DIAGNOSIS — I10 Essential (primary) hypertension: Secondary | ICD-10-CM | POA: Diagnosis not present

## 2020-12-09 DIAGNOSIS — Z79899 Other long term (current) drug therapy: Secondary | ICD-10-CM | POA: Diagnosis not present

## 2020-12-09 DIAGNOSIS — Z0000 Encounter for general adult medical examination without abnormal findings: Secondary | ICD-10-CM | POA: Diagnosis not present

## 2020-12-09 DIAGNOSIS — E7800 Pure hypercholesterolemia, unspecified: Secondary | ICD-10-CM | POA: Diagnosis not present

## 2020-12-19 DIAGNOSIS — Z6832 Body mass index (BMI) 32.0-32.9, adult: Secondary | ICD-10-CM | POA: Diagnosis not present

## 2020-12-19 DIAGNOSIS — M25472 Effusion, left ankle: Secondary | ICD-10-CM | POA: Diagnosis not present

## 2020-12-19 DIAGNOSIS — R030 Elevated blood-pressure reading, without diagnosis of hypertension: Secondary | ICD-10-CM | POA: Diagnosis not present

## 2020-12-19 DIAGNOSIS — M79672 Pain in left foot: Secondary | ICD-10-CM | POA: Diagnosis not present

## 2021-02-22 DIAGNOSIS — Z1231 Encounter for screening mammogram for malignant neoplasm of breast: Secondary | ICD-10-CM | POA: Diagnosis not present

## 2021-03-10 DIAGNOSIS — H52201 Unspecified astigmatism, right eye: Secondary | ICD-10-CM | POA: Diagnosis not present

## 2021-03-10 DIAGNOSIS — H524 Presbyopia: Secondary | ICD-10-CM | POA: Diagnosis not present

## 2021-03-10 DIAGNOSIS — E119 Type 2 diabetes mellitus without complications: Secondary | ICD-10-CM | POA: Diagnosis not present

## 2021-03-10 DIAGNOSIS — H5213 Myopia, bilateral: Secondary | ICD-10-CM | POA: Diagnosis not present

## 2021-03-10 DIAGNOSIS — Z961 Presence of intraocular lens: Secondary | ICD-10-CM | POA: Diagnosis not present

## 2021-03-10 DIAGNOSIS — Z7984 Long term (current) use of oral hypoglycemic drugs: Secondary | ICD-10-CM | POA: Diagnosis not present

## 2021-03-21 DIAGNOSIS — M85851 Other specified disorders of bone density and structure, right thigh: Secondary | ICD-10-CM | POA: Diagnosis not present

## 2021-03-21 DIAGNOSIS — Z780 Asymptomatic menopausal state: Secondary | ICD-10-CM | POA: Diagnosis not present

## 2021-03-21 DIAGNOSIS — M85852 Other specified disorders of bone density and structure, left thigh: Secondary | ICD-10-CM | POA: Diagnosis not present

## 2021-03-21 DIAGNOSIS — M810 Age-related osteoporosis without current pathological fracture: Secondary | ICD-10-CM | POA: Diagnosis not present

## 2021-04-04 ENCOUNTER — Encounter (INDEPENDENT_AMBULATORY_CARE_PROVIDER_SITE_OTHER): Payer: PPO | Admitting: Ophthalmology

## 2021-04-18 ENCOUNTER — Other Ambulatory Visit: Payer: Self-pay

## 2021-04-18 ENCOUNTER — Ambulatory Visit (INDEPENDENT_AMBULATORY_CARE_PROVIDER_SITE_OTHER): Payer: PPO | Admitting: Ophthalmology

## 2021-04-18 ENCOUNTER — Encounter (INDEPENDENT_AMBULATORY_CARE_PROVIDER_SITE_OTHER): Payer: Self-pay | Admitting: Ophthalmology

## 2021-04-18 DIAGNOSIS — E119 Type 2 diabetes mellitus without complications: Secondary | ICD-10-CM | POA: Diagnosis not present

## 2021-04-18 DIAGNOSIS — E1165 Type 2 diabetes mellitus with hyperglycemia: Secondary | ICD-10-CM

## 2021-04-18 DIAGNOSIS — H348122 Central retinal vein occlusion, left eye, stable: Secondary | ICD-10-CM

## 2021-04-18 HISTORY — DX: Type 2 diabetes mellitus with hyperglycemia: E11.65

## 2021-04-18 NOTE — Assessment & Plan Note (Signed)
No detectable diabetic retinopathy 

## 2021-04-18 NOTE — Assessment & Plan Note (Signed)
OS, old compensated CRV O, no recurrence of CME affecting center involvement  neovascularization we will continue to monitor and observe preserved

## 2021-04-18 NOTE — Progress Notes (Signed)
04/18/2021     CHIEF COMPLAINT Patient presents for  Chief Complaint  Patient presents with   Retina Follow Up      HISTORY OF PRESENT ILLNESS: Kristen Potter is a 71 y.o. female who presents to the clinic today for:   HPI     Retina Follow Up           Diagnosis: CRVO/BRVO   Laterality: left eye   Onset: 4 months ago   Course: stable         Comments   4 mos fu ou oct fp. Patient states vision is stable and unchanged since last visit. Denies any new floaters or FOL.       Last edited by Laurin Coder on 04/18/2021 11:17 AM.      Referring physician: No referring provider defined for this encounter.  HISTORICAL INFORMATION:   Selected notes from the MEDICAL RECORD NUMBER       CURRENT MEDICATIONS: No current outpatient medications on file. (Ophthalmic Drugs)   No current facility-administered medications for this visit. (Ophthalmic Drugs)   Current Outpatient Medications (Other)  Medication Sig   amoxicillin (AMOXIL) 500 MG capsule Take 2 capsules (1,000 mg total) by mouth 2 (two) times daily.   aspirin 81 MG tablet Take 81 mg by mouth daily. (Patient not taking: Reported on 08/12/2019)   Calcium Carb-Cholecalciferol (CALCIUM 600 + D PO) Take by mouth.   glipiZIDE (GLUCOTROL XL) 2.5 MG 24 hr tablet Take 2.5 mg by mouth daily with breakfast.   hydrochlorothiazide (HYDRODIURIL) 25 MG tablet Take 25 mg by mouth daily.   lisinopril (PRINIVIL,ZESTRIL) 20 MG tablet Take 20 mg by mouth daily.   metFORMIN (GLUCOPHAGE) 500 MG tablet Take by mouth 2 (two) times daily with a meal.   simvastatin (ZOCOR) 40 MG tablet Take 40 mg by mouth daily.   No current facility-administered medications for this visit. (Other)      REVIEW OF SYSTEMS: ROS   Negative for: Constitutional, Gastrointestinal, Neurological, Skin, Genitourinary, Musculoskeletal, HENT, Endocrine, Cardiovascular, Eyes, Respiratory, Psychiatric, Allergic/Imm, Heme/Lymph Last edited by Hurman Horn, MD on 04/18/2021 11:48 AM.       ALLERGIES Allergies  Allergen Reactions   Aspirin Nausea And Vomiting   Latex     PAST MEDICAL HISTORY Past Medical History:  Diagnosis Date   Age-related nuclear cataract of left eye 08/12/2019   Age-related nuclear cataract of right eye 08/12/2019   Cataract    Diabetes mellitus without complication ([place])    Hematuria    Hyperlipidemia    Hypertension    Osteopenia    Past Surgical History:  Procedure Laterality Date   FOOT SURGERY Right    VAGINAL HYSTERECTOMY      FAMILY HISTORY History reviewed. No pertinent family history.  SOCIAL HISTORY Social History   Tobacco Use   Smoking status: Former   Smokeless tobacco: Never  Substance Use Topics   Alcohol use: No         OPHTHALMIC EXAM:  Base Eye Exam     Visual Acuity (ETDRS)       Right Left   Dist [place] 20/20 20/20 -1         Tonometry (Tonopen, 11:18 AM)       Right Left   Pressure 16 17         Pupils       Pupils Dark Light Shape React APD   Right PERRL 4 3 Round Brisk None  Left PERRL 4 3 Round Brisk None         Visual Fields (Counting fingers)       Left Right    Full Full         Extraocular Movement       Right Left    Full Full         Neuro/Psych     Oriented x3: Yes   Mood/Affect: Normal         Dilation     Both eyes: 1.0% Mydriacyl, 2.5% Phenylephrine @ 11:18 AM           Slit Lamp and Fundus Exam     External Exam       Right Left   External Normal Normal         Slit Lamp Exam       Right Left   Lids/Lashes Normal Normal   Conjunctiva/Sclera White and quiet White and quiet   Cornea Clear Clear   Anterior Chamber Deep and quiet Deep and quiet   Iris Round and reactive Round and reactive   Lens Centered posterior chamber intraocular lens Centered posterior chamber intraocular lens   Anterior Vitreous Normal Normal         Fundus Exam       Right Left   Posterior Vitreous Normal  Normal, no cells   Disc Normal Collaterals   C/D Ratio  0.55   Macula Normal Microaneurysms, Exudates, Cystoid macular edema no longer center involved   Vessels Normal Old Hemi-CRVO ,,   Periphery Normal Regional white lines of nonperfusion distal inferotemporal venous arcade, retinal nonperfusion            IMAGING AND PROCEDURES  Imaging and Procedures for 04/18/21  OCT, Retina - OU - Both Eyes       Right Eye Quality was good. Scan locations included subfoveal. Central Foveal Thickness: 252. Progression has been stable.   Left Eye Quality was good. Scan locations included subfoveal. Central Foveal Thickness: 291. Progression has improved.   Notes Old Hemi central CRV O with no active CME.  OS  We will continue to monitor and observe     Color Fundus Photography Optos - OU - Both Eyes       Right Eye Progression has been stable. Disc findings include normal observations. Macula : normal observations. Vessels : normal observations. Periphery : normal observations.   Left Eye Progression has been stable. Macula : microaneurysms, edema.   Notes     OS; regional white lines of nonperfusion distal inferotemporal venous arcade, retinal nonperfusion, secondary to prior old hemicentral retinal vein occlusion.  Large region of nonperfusion poses risk of neovascularization elsewhere.  We will continue to monitor and observe OS no contributory CME   OS with collaterals on the optic nerve               ASSESSMENT/PLAN:  Central retinal vein occlusion of left eye OS, old compensated CRV O, no recurrence of CME affecting center involvement  neovascularization we will continue to monitor and observe preserved  Diabetes mellitus without complication (HCC) No detectable diabetic retinopathy     ICD-10-CM   1. Central retinal vein occlusion of left eye, unspecified complication status  [id number] OCT, Retina - OU - Both Eyes    Color Fundus Photography Optos - OU -  Both Eyes    2. Diabetes mellitus without complication (HCC)  [id number]       1.  OU no detectable  diabetic retinopathy  2.  OS no recurrence of neovascular disease nor CME will continue to monitor and observe  3.  Ophthalmic Meds Ordered this visit:  No orders of the defined types were placed in this encounter.      Return in about 6 months (around 10/16/2021) for DILATE OU, COLOR FP, OCT.  There are no Patient Instructions on file for this visit.   Explained the diagnoses, plan, and follow up with the patient and they expressed understanding.  Patient expressed understanding of the importance of proper follow up care.   Clent Demark Paticia Moster M.D. Diseases & Surgery of the Retina and Vitreous [facility] 04/18/21     Abbreviations: M myopia (nearsighted); A astigmatism; H hyperopia (farsighted); P presbyopia; Mrx spectacle prescription;  CTL contact lenses; OD right eye; OS left eye; OU both eyes  XT exotropia; ET esotropia; PEK punctate epithelial keratitis; PEE punctate epithelial erosions; DES dry eye syndrome; MGD meibomian gland dysfunction; ATs artificial tears; PFAT's preservative free artificial tears; [place] nuclear sclerotic cataract; PSC posterior subcapsular cataract; ERM epi-retinal membrane; PVD posterior vitreous detachment; RD retinal detachment; DM diabetes mellitus; DR diabetic retinopathy; NPDR non-proliferative diabetic retinopathy; PDR proliferative diabetic retinopathy; CSME clinically significant macular edema; DME diabetic macular edema; dbh dot blot hemorrhages; CWS cotton wool spot; POAG primary open angle glaucoma; C/D cup-to-disc ratio; HVF humphrey visual field; GVF goldmann visual field; OCT optical coherence tomography; IOP intraocular pressure; BRVO Branch retinal vein occlusion; CRVO central retinal vein occlusion; CRAO central retinal artery occlusion; BRAO branch retinal artery occlusion; RT retinal tear; SB scleral buckle; PPV pars plana  vitrectomy; VH Vitreous hemorrhage; PRP panretinal laser photocoagulation; IVK intravitreal kenalog; VMT vitreomacular traction; MH Macular hole;  NVD neovascularization of the disc; NVE neovascularization elsewhere; AREDS age related eye disease study; ARMD age related macular degeneration; POAG primary open angle glaucoma; EBMD epithelial/anterior basement membrane dystrophy; ACIOL anterior chamber intraocular lens; IOL intraocular lens; PCIOL posterior chamber intraocular lens; Phaco/IOL phacoemulsification with intraocular lens placement; [place] photorefractive keratectomy; LASIK laser assisted in situ keratomileusis; HTN hypertension; DM diabetes mellitus; COPD chronic obstructive pulmonary disease

## 2021-06-10 DIAGNOSIS — I10 Essential (primary) hypertension: Secondary | ICD-10-CM | POA: Diagnosis not present

## 2021-06-10 DIAGNOSIS — M810 Age-related osteoporosis without current pathological fracture: Secondary | ICD-10-CM | POA: Diagnosis not present

## 2021-06-10 DIAGNOSIS — E7800 Pure hypercholesterolemia, unspecified: Secondary | ICD-10-CM | POA: Diagnosis not present

## 2021-06-10 DIAGNOSIS — E1169 Type 2 diabetes mellitus with other specified complication: Secondary | ICD-10-CM | POA: Diagnosis not present

## 2021-07-20 DIAGNOSIS — J060 Acute laryngopharyngitis: Secondary | ICD-10-CM | POA: Diagnosis not present

## 2021-07-20 DIAGNOSIS — I10 Essential (primary) hypertension: Secondary | ICD-10-CM | POA: Insufficient documentation

## 2021-07-20 DIAGNOSIS — E782 Mixed hyperlipidemia: Secondary | ICD-10-CM

## 2021-07-20 DIAGNOSIS — Z20822 Contact with and (suspected) exposure to covid-19: Secondary | ICD-10-CM | POA: Diagnosis not present

## 2021-07-20 DIAGNOSIS — J208 Acute bronchitis due to other specified organisms: Secondary | ICD-10-CM | POA: Diagnosis not present

## 2021-07-20 DIAGNOSIS — J0141 Acute recurrent pansinusitis: Secondary | ICD-10-CM | POA: Diagnosis not present

## 2021-07-20 DIAGNOSIS — E1165 Type 2 diabetes mellitus with hyperglycemia: Secondary | ICD-10-CM | POA: Diagnosis not present

## 2021-07-20 DIAGNOSIS — B9689 Other specified bacterial agents as the cause of diseases classified elsewhere: Secondary | ICD-10-CM | POA: Diagnosis not present

## 2021-07-20 HISTORY — DX: Essential (primary) hypertension: I10

## 2021-07-20 HISTORY — DX: Mixed hyperlipidemia: E78.2

## 2021-10-17 ENCOUNTER — Ambulatory Visit (INDEPENDENT_AMBULATORY_CARE_PROVIDER_SITE_OTHER): Payer: PPO | Admitting: Ophthalmology

## 2021-10-17 ENCOUNTER — Encounter (INDEPENDENT_AMBULATORY_CARE_PROVIDER_SITE_OTHER): Payer: Self-pay | Admitting: Ophthalmology

## 2021-10-17 DIAGNOSIS — E119 Type 2 diabetes mellitus without complications: Secondary | ICD-10-CM | POA: Diagnosis not present

## 2021-10-17 DIAGNOSIS — Z961 Presence of intraocular lens: Secondary | ICD-10-CM

## 2021-10-17 DIAGNOSIS — H348122 Central retinal vein occlusion, left eye, stable: Secondary | ICD-10-CM | POA: Diagnosis not present

## 2021-10-17 NOTE — Progress Notes (Signed)
10/17/2021     CHIEF COMPLAINT Patient presents for  Chief Complaint  Patient presents with   Central Retinal Vein Occlusion      HISTORY OF PRESENT ILLNESS: Kristen Potter is a 71 y.o. female who presents to the clinic today for:   HPI   6 MOS for DILATE , COLOR FP , OCT. Pt stated vision has remained stable since last visit.   Last edited by Angeline Slim on 10/17/2021 10:15 AM.      Referring physician: No referring provider defined for this encounter.  HISTORICAL INFORMATION:   Selected notes from the MEDICAL RECORD NUMBER       CURRENT MEDICATIONS: No current outpatient medications on file. (Ophthalmic Drugs)   No current facility-administered medications for this visit. (Ophthalmic Drugs)   Current Outpatient Medications (Other)  Medication Sig   amoxicillin (AMOXIL) 500 MG capsule Take 2 capsules (1,000 mg total) by mouth 2 (two) times daily.   aspirin 81 MG tablet Take 81 mg by mouth daily. (Patient not taking: Reported on 08/12/2019)   Calcium Carb-Cholecalciferol (CALCIUM 600 + D PO) Take by mouth.   glipiZIDE (GLUCOTROL XL) 2.5 MG 24 hr tablet Take 2.5 mg by mouth daily with breakfast.   hydrochlorothiazide (HYDRODIURIL) 25 MG tablet Take 25 mg by mouth daily.   lisinopril (PRINIVIL,ZESTRIL) 20 MG tablet Take 20 mg by mouth daily.   metFORMIN (GLUCOPHAGE) 500 MG tablet Take by mouth 2 (two) times daily with a meal.   simvastatin (ZOCOR) 40 MG tablet Take 40 mg by mouth daily.   No current facility-administered medications for this visit. (Other)      REVIEW OF SYSTEMS: ROS   Negative for: Constitutional, Gastrointestinal, Neurological, Skin, Genitourinary, Musculoskeletal, HENT, Endocrine, Cardiovascular, Eyes, Respiratory, Psychiatric, Allergic/Imm, Heme/Lymph Last edited by Angeline Slim on 10/17/2021 10:15 AM.       ALLERGIES Allergies  Allergen Reactions   Aspirin Nausea And Vomiting   Latex     PAST MEDICAL HISTORY Past Medical History:   Diagnosis Date   Age-related nuclear cataract of left eye 08/12/2019   Age-related nuclear cataract of right eye 08/12/2019   Cataract    Diabetes mellitus without complication (HCC)    Hematuria    Hyperlipidemia    Hypertension    Osteopenia    Past Surgical History:  Procedure Laterality Date   FOOT SURGERY Right    VAGINAL HYSTERECTOMY      FAMILY HISTORY History reviewed. No pertinent family history.  SOCIAL HISTORY Social History   Tobacco Use   Smoking status: Former   Smokeless tobacco: Never  Substance Use Topics   Alcohol use: No         OPHTHALMIC EXAM:  Base Eye Exam     Visual Acuity (ETDRS)       Right Left   Dist [place] 20/20 -2 20/20 -2         Tonometry (Tonopen, 10:21 AM)       Right Left   Pressure 15 16         Pupils       Pupils APD   Right PERRL None   Left PERRL None         Visual Fields       Left Right    Full Full         Extraocular Movement       Right Left    Full, Ortho Full, Ortho         Neuro/Psych  Oriented x3: Yes   Mood/Affect: Normal         Dilation     Both eyes: 1.0% Mydriacyl, 2.5% Phenylephrine @ 10:21 AM           Slit Lamp and Fundus Exam     External Exam       Right Left   External Normal Normal         Slit Lamp Exam       Right Left   Lids/Lashes Normal Normal   Conjunctiva/Sclera White and quiet White and quiet   Cornea Clear Clear   Anterior Chamber Deep and quiet Deep and quiet   Iris Round and reactive Round and reactive   Lens Centered posterior chamber intraocular lens Centered posterior chamber intraocular lens   Anterior Vitreous Normal Normal         Fundus Exam       Right Left   Posterior Vitreous Normal Normal, no cells   Disc Normal Collaterals   C/D Ratio 0.55 0.55   Macula Normal Microaneurysms, Exudates, Cystoid macular edema no longer center involved   Vessels Normal Old Hemi-CRVO ,,   Periphery Normal Regional white lines of  nonperfusion distal inferotemporal venous arcade, retinal nonperfusion            IMAGING AND PROCEDURES  Imaging and Procedures for 10/17/21  OCT, Retina - OU - Both Eyes       Right Eye Quality was good. Scan locations included subfoveal. Central Foveal Thickness: 257. Progression has been stable.   Left Eye Quality was good. Scan locations included subfoveal. Central Foveal Thickness: 284. Progression has improved.   Notes Old Hemi central CRV O with no active CME.  OS  We will continue to monitor and observe     Color Fundus Photography Optos - OU - Both Eyes       Right Eye Progression has been stable. Disc findings include normal observations. Macula : normal observations. Vessels : normal observations. Periphery : normal observations.   Left Eye Progression has been stable. Macula : edema, microaneurysms.   Notes     OS; regional white lines of nonperfusion distal inferotemporal venous arcade, retinal nonperfusion, secondary to prior old hemicentral retinal vein occlusion.  Large region of nonperfusion poses risk of neovascularization elsewhere.  We will continue to monitor and observe OS no contributory CME   OS with collaterals on the optic nerve               ASSESSMENT/PLAN:  Central retinal vein occlusion of left eye OS stabilized we will continue to watch temporal regions of nonperfusion.  No recurrence of CME OS.  Will observe  Diabetes mellitus without complication (HCC) No detectable DR  Pseudophakia of both eyes Stable OU     ICD-10-CM   1. Central retinal vein occlusion of left eye, unspecified complication status  H34.8122 OCT, Retina - OU - Both Eyes    Color Fundus Photography Optos - OU - Both Eyes    2. Diabetes mellitus without complication (HCC)  E11.9     3. Pseudophakia of both eyes  Z96.1       1.  OS no interval change in region of macular edema temporal to the fovea.  No center involvement.  Stable over now years.   We will continue to monitor and observe.  2.  Temporally region of nonperfusion stable OS.  3.  Pseudophakia OU use stable over time  No detectable diabetic retinopathy  Ophthalmic Meds  Ordered this visit:  No orders of the defined types were placed in this encounter.      Return in about 7 months (around 05/18/2022) for DILATE OU, COLOR FP, OCT.  There are no Patient Instructions on file for this visit.   Explained the diagnoses, plan, and follow up with the patient and they expressed understanding.  Patient expressed understanding of the importance of proper follow up care.   Alford Highland Lauraann Missey M.D. Diseases & Surgery of the Retina and Vitreous [facility] 10/17/21     Abbreviations: M myopia (nearsighted); A astigmatism; H hyperopia (farsighted); P presbyopia; Mrx spectacle prescription;  CTL contact lenses; OD right eye; OS left eye; OU both eyes  XT exotropia; ET esotropia; PEK punctate epithelial keratitis; PEE punctate epithelial erosions; DES dry eye syndrome; MGD meibomian gland dysfunction; ATs artificial tears; PFAT's preservative free artificial tears; [place] nuclear sclerotic cataract; PSC posterior subcapsular cataract; ERM epi-retinal membrane; PVD posterior vitreous detachment; RD retinal detachment; DM diabetes mellitus; DR diabetic retinopathy; NPDR non-proliferative diabetic retinopathy; PDR proliferative diabetic retinopathy; CSME clinically significant macular edema; DME diabetic macular edema; dbh dot blot hemorrhages; CWS cotton wool spot; POAG primary open angle glaucoma; C/D cup-to-disc ratio; HVF humphrey visual field; GVF goldmann visual field; OCT optical coherence tomography; IOP intraocular pressure; BRVO Branch retinal vein occlusion; CRVO central retinal vein occlusion; CRAO central retinal artery occlusion; BRAO branch retinal artery occlusion; RT retinal tear; SB scleral buckle; PPV pars plana vitrectomy; VH Vitreous hemorrhage; PRP panretinal  laser photocoagulation; IVK intravitreal kenalog; VMT vitreomacular traction; MH Macular hole;  NVD neovascularization of the disc; NVE neovascularization elsewhere; AREDS age related eye disease study; ARMD age related macular degeneration; POAG primary open angle glaucoma; EBMD epithelial/anterior basement membrane dystrophy; ACIOL anterior chamber intraocular lens; IOL intraocular lens; PCIOL posterior chamber intraocular lens; Phaco/IOL phacoemulsification with intraocular lens placement; PRK photorefractive keratectomy; LASIK laser assisted in situ keratomileusis; HTN hypertension; DM diabetes mellitus; COPD chronic obstructive pulmonary disease

## 2021-10-17 NOTE — Assessment & Plan Note (Signed)
No detectable DR 

## 2021-10-17 NOTE — Assessment & Plan Note (Signed)
OS stabilized we will continue to watch temporal regions of nonperfusion.  No recurrence of CME OS.  Will observe

## 2021-10-17 NOTE — Assessment & Plan Note (Signed)
Stable OU 

## 2021-12-09 ENCOUNTER — Emergency Department (HOSPITAL_BASED_OUTPATIENT_CLINIC_OR_DEPARTMENT_OTHER): Payer: PPO

## 2021-12-09 ENCOUNTER — Other Ambulatory Visit: Payer: Self-pay

## 2021-12-09 ENCOUNTER — Encounter (HOSPITAL_BASED_OUTPATIENT_CLINIC_OR_DEPARTMENT_OTHER): Payer: Self-pay | Admitting: Emergency Medicine

## 2021-12-09 ENCOUNTER — Emergency Department (HOSPITAL_BASED_OUTPATIENT_CLINIC_OR_DEPARTMENT_OTHER)
Admission: EM | Admit: 2021-12-09 | Discharge: 2021-12-09 | Disposition: A | Discharge status: Home / Self Care | Payer: PPO | Attending: Emergency Medicine | Admitting: Emergency Medicine

## 2021-12-09 DIAGNOSIS — Z91040 Latex allergy status: Secondary | ICD-10-CM | POA: Diagnosis not present

## 2021-12-09 DIAGNOSIS — I10 Essential (primary) hypertension: Secondary | ICD-10-CM | POA: Diagnosis not present

## 2021-12-09 DIAGNOSIS — M79602 Pain in left arm: Secondary | ICD-10-CM | POA: Insufficient documentation

## 2021-12-09 DIAGNOSIS — Z7984 Long term (current) use of oral hypoglycemic drugs: Secondary | ICD-10-CM | POA: Insufficient documentation

## 2021-12-09 DIAGNOSIS — I700 Atherosclerosis of aorta: Secondary | ICD-10-CM | POA: Diagnosis not present

## 2021-12-09 DIAGNOSIS — Z79899 Other long term (current) drug therapy: Secondary | ICD-10-CM | POA: Insufficient documentation

## 2021-12-09 LAB — CBC WITH DIFFERENTIAL/PLATELET
Abs Immature Granulocytes: 0 10*3/uL (ref 0.00–0.07)
Basophils Absolute: 0 10*3/uL (ref 0.0–0.1)
Basophils Relative: 1 %
Eosinophils Absolute: 0.2 10*3/uL (ref 0.0–0.5)
Eosinophils Relative: 3 %
HCT: 38.6 % (ref 36.0–46.0)
Hemoglobin: 12.7 g/dL (ref 12.0–15.0)
Immature Granulocytes: 0 %
Lymphocytes Relative: 32 %
Lymphs Abs: 1.5 10*3/uL (ref 0.7–4.0)
MCH: 29.5 pg (ref 26.0–34.0)
MCHC: 32.9 g/dL (ref 30.0–36.0)
MCV: 89.6 fL (ref 80.0–100.0)
Monocytes Absolute: 0.4 10*3/uL (ref 0.1–1.0)
Monocytes Relative: 8 %
Neutro Abs: 2.7 10*3/uL (ref 1.7–7.7)
Neutrophils Relative %: 56 %
Platelets: 278 10*3/uL (ref 150–400)
RBC: 4.31 MIL/uL (ref 3.87–5.11)
RDW: 12.6 % (ref 11.5–15.5)
WBC: 4.8 10*3/uL (ref 4.0–10.5)
nRBC: 0 % (ref 0.0–0.2)

## 2021-12-09 LAB — BASIC METABOLIC PANEL
Anion gap: 9 (ref 5–15)
BUN: 13 mg/dL (ref 8–23)
CO2: 26 mmol/L (ref 22–32)
Calcium: 9.1 mg/dL (ref 8.9–10.3)
Chloride: 101 mmol/L (ref 98–111)
Creatinine, Ser: 0.71 mg/dL (ref 0.44–1.00)
GFR, Estimated: >60 mL/min (ref >60)
Glucose, Bld: 175 mg/dL — ABNORMAL HIGH (ref 70–99)
Potassium: 3.7 mmol/L (ref 3.5–5.1)
Sodium: 136 mmol/L (ref 135–145)

## 2021-12-09 LAB — TROPONIN I (HIGH SENSITIVITY)
Troponin I (High Sensitivity): 3 ng/L (ref <18)
Troponin I (High Sensitivity): 3 ng/L (ref <18)

## 2021-12-09 MED ORDER — ONDANSETRON 4 MG PO TBDP
4.0000 mg | ORAL_TABLET | Freq: Once | ORAL | Status: AC
  Administered 2021-12-09: 4 mg via ORAL
  Filled 2021-12-09: qty 1

## 2021-12-09 MED ORDER — LIDOCAINE 5 % EX PTCH
1.0000 | MEDICATED_PATCH | CUTANEOUS | Status: DC
  Administered 2021-12-09: 1 via TRANSDERMAL
  Filled 2021-12-09: qty 1

## 2021-12-09 MED ORDER — HYDROCHLOROTHIAZIDE 25 MG PO TABS
25.0000 mg | ORAL_TABLET | Freq: Once | ORAL | Status: AC
  Administered 2021-12-09: 25 mg via ORAL
  Filled 2021-12-09: qty 1

## 2021-12-09 MED ORDER — KETOROLAC TROMETHAMINE 30 MG/ML IJ SOLN
15.0000 mg | Freq: Once | INTRAMUSCULAR | Status: AC
  Administered 2021-12-09: 15 mg via INTRAMUSCULAR
  Filled 2021-12-09: qty 1

## 2021-12-09 MED ORDER — KETOROLAC TROMETHAMINE 30 MG/ML IJ SOLN: 15.0000 mg | Freq: Once | INTRAMUSCULAR | Status: DC

## 2021-12-09 NOTE — ED Provider Notes (Signed)
Patient here with high blood pressure.  Handed off to me with patient pending repeat troponin.  Plan is for repeat troponin and discharge if unremarkable.  History of high blood pressure.  Labs thus far and imaging are unremarkable.  Pain thought to be likely musculoskeletal.  Troponin within normal limits.  Does not have any active pain.  We will have her follow-up with primary care doctor.  Discharged in good condition.  This chart was dictated using voice recognition software.  Despite best efforts to proofread,  errors can occur which can change the documentation meaning.    Lennice Sites, DO 12/09/21 0725

## 2021-12-09 NOTE — Discharge Instructions (Addendum)
Overall blood work today is unremarkable.  Follow-up with your primary care doctor.  I have also referred you to cardiology for further outpatient work-up of your discomfort.  Please return if symptoms worsen.

## 2021-12-09 NOTE — ED Provider Notes (Signed)
[facility] EMERGENCY DEPARTMENT Provider Note   CSN: [id number] Arrival date & time: 12/09/21  1950     History  Chief Complaint  Patient presents with   Hypertension    Kristen Potter is a 71 y.o. female.   Hypertension This is a chronic problem. The current episode started less than 1 hour ago (but worse tonight and had 2 nights of L arm pain and was concerned). The problem occurs constantly. The problem has been gradually worsening. Pertinent negatives include no chest pain, no abdominal pain, no headaches and no shortness of breath. Nothing aggravates the symptoms. Nothing relieves the symptoms. She has tried nothing for the symptoms. The treatment provided no relief.       Home Medications Prior to Admission medications   Medication Sig Start Date End Date Taking? Authorizing Provider  amoxicillin (AMOXIL) 500 MG capsule Take 2 capsules (1,000 mg total) by mouth 2 (two) times daily. 11/05/15   Melene Plan, DO  aspirin 81 MG tablet Take 81 mg by mouth daily. Patient not taking: Reported on 08/12/2019    [provider]  Calcium Carb-Cholecalciferol (CALCIUM 600 + D PO) Take by mouth.    [provider]  glipiZIDE (GLUCOTROL XL) 2.5 MG 24 hr tablet Take 2.5 mg by mouth daily with breakfast.    [provider]  hydrochlorothiazide (HYDRODIURIL) 25 MG tablet Take 25 mg by mouth daily.    [provider]  lisinopril (PRINIVIL,ZESTRIL) 20 MG tablet Take 20 mg by mouth daily.    [provider]  metFORMIN (GLUCOPHAGE) 500 MG tablet Take by mouth 2 (two) times daily with a meal.    [provider]  simvastatin (ZOCOR) 40 MG tablet Take 40 mg by mouth daily.    [provider]      Allergies    Aspirin and Latex    Review of Systems   Review of Systems  Constitutional:  Negative for fever.  HENT:  Negative for facial swelling.   Eyes:  Negative for redness.  Respiratory:  Negative for shortness of  breath.   Cardiovascular:  Negative for chest pain.  Gastrointestinal:  Negative for abdominal pain.  Genitourinary:  Negative for dysuria.  Neurological:  Negative for headaches.  All other systems reviewed and are negative.   Physical Exam Updated Vital Signs BP (!) 166/69   Pulse 69   Temp 97.8 F (36.6 C) (Oral)   Resp 12   Ht 5' 6.5" (1.689 m)   Wt 59.9 kg   SpO2 99%   BMI 20.99 kg/m  Physical Exam Vitals and nursing note reviewed.  Constitutional:      General: She is not in acute distress.    Appearance: Normal appearance. She is well-developed.  HENT:     Head: Normocephalic and atraumatic.     Nose: Nose normal.  Eyes:     Pupils: Pupils are equal, round, and reactive to light.  Cardiovascular:     Rate and Rhythm: Normal rate and regular rhythm.     Pulses: Normal pulses.     Heart sounds: Normal heart sounds.  Pulmonary:     Effort: Pulmonary effort is normal. No respiratory distress.     Breath sounds: Normal breath sounds.  Abdominal:     General: Bowel sounds are normal. There is no distension.     Palpations: Abdomen is soft.     Tenderness: There is no abdominal tenderness. There is no guarding or rebound.  Genitourinary:  Vagina: No vaginal discharge.  Musculoskeletal:        General: No swelling, tenderness or deformity. Normal range of motion.     Left shoulder: Normal.     Left upper arm: Normal. Bony tenderness: negative neers test LUE.     Left elbow: Normal.     Left forearm: Normal.     Left wrist: Normal. No bony tenderness, snuff box tenderness or crepitus.     Cervical back: Normal range of motion and neck supple.  Skin:    General: Skin is warm and dry.     Capillary Refill: Capillary refill takes less than 2 seconds.     Findings: No erythema or rash.  Neurological:     General: No focal deficit present.     Mental Status: She is alert and oriented to person, place, and time.     Deep Tendon Reflexes: Reflexes normal.   Psychiatric:        Mood and Affect: Mood normal.     ED Results / Procedures / Treatments   Labs (all labs ordered are listed, but only abnormal results are displayed) Results for orders placed or performed during the hospital encounter of 12/09/21  CBC with Differential  Result Value Ref Range   WBC 4.8 4.0 - 10.5 K/uL   RBC 4.31 3.87 - 5.11 MIL/uL   Hemoglobin 12.7 12.0 - 15.0 g/dL   HCT 45.8 59.2 - 92.4 %   MCV 89.6 80.0 - 100.0 fL   MCH 29.5 26.0 - 34.0 pg   MCHC 32.9 30.0 - 36.0 g/dL   RDW 46.2 86.3 - 81.7 %   Platelets 278 150 - 400 K/uL   nRBC 0.0 0.0 - 0.2 %   Neutrophils Relative % 56 %   Neutro Abs 2.7 1.7 - 7.7 K/uL   Lymphocytes Relative 32 %   Lymphs Abs 1.5 0.7 - 4.0 K/uL   Monocytes Relative 8 %   Monocytes Absolute 0.4 0.1 - 1.0 K/uL   Eosinophils Relative 3 %   Eosinophils Absolute 0.2 0.0 - 0.5 K/uL   Basophils Relative 1 %   Basophils Absolute 0.0 0.0 - 0.1 K/uL   Immature Granulocytes 0 %   Abs Immature Granulocytes 0.00 0.00 - 0.07 K/uL  Basic metabolic panel  Result Value Ref Range   Sodium 136 135 - 145 mmol/L   Potassium 3.7 3.5 - 5.1 mmol/L   Chloride 101 98 - 111 mmol/L   CO2 26 22 - 32 mmol/L   Glucose, Bld 175 (H) 70 - 99 mg/dL   BUN 13 8 - 23 mg/dL   Creatinine, Ser 7.11 0.44 - 1.00 mg/dL   Calcium 9.1 8.9 - 65.7 mg/dL   GFR, Estimated >90 >38 mL/min   Anion gap 9 5 - 15  Troponin I (High Sensitivity)  Result Value Ref Range   Troponin I (High Sensitivity) 3 <18 ng/L   DG Chest Portable 1 View  Result Date: 12/09/2021 CLINICAL DATA:  71 year old female with history of hypertension and left arm pain. EXAM: PORTABLE CHEST 1 VIEW COMPARISON:  Chest x-ray 08/07/2007. FINDINGS: Lung volumes are normal. No consolidative airspace disease. No pleural effusions. No pneumothorax. No pulmonary nodule or mass noted. Pulmonary vasculature and the cardiomediastinal silhouette are within normal limits. Atherosclerotic calcifications are noted in  the thoracic aorta. IMPRESSION: 1.  No radiographic evidence of acute cardiopulmonary disease. 2. Aortic atherosclerosis. Electronically Signed   By: Trudie Reed M.D.   On: 12/09/2021 05:42  EKG EKG Interpretation  Date/Time:  Friday December 09 2021 04:51:49 EDT Ventricular Rate:  81 PR Interval:  168 QRS Duration: 76 QT Interval:  370 QTC Calculation: 429 R Axis:   64 Text Interpretation: Normal sinus rhythm Right atrial enlargement Confirmed by Dory Horn) on 12/09/2021 4:57:38 AM  Radiology DG Chest Portable 1 View  Result Date: 12/09/2021 CLINICAL DATA:  71 year old female with history of hypertension and left arm pain. EXAM: PORTABLE CHEST 1 VIEW COMPARISON:  Chest x-ray 08/07/2007. FINDINGS: Lung volumes are normal. No consolidative airspace disease. No pleural effusions. No pneumothorax. No pulmonary nodule or mass noted. Pulmonary vasculature and the cardiomediastinal silhouette are within normal limits. Atherosclerotic calcifications are noted in the thoracic aorta. IMPRESSION: 1.  No radiographic evidence of acute cardiopulmonary disease. 2. Aortic atherosclerosis. Electronically Signed   By: Vinnie Langton M.D.   On: 12/09/2021 05:42    Procedures Procedures    Medications Ordered in ED Medications  lidocaine (LIDODERM) 5 % 1 patch (1 patch Transdermal Patch Applied 12/09/21 0607)  ketorolac (TORADOL) 30 MG/ML injection 15 mg (15 mg Intramuscular Given 12/09/21 0607)  ondansetron (ZOFRAN-ODT) disintegrating tablet 4 mg (4 mg Oral Given 12/09/21 0607)  hydrochlorothiazide (HYDRODIURIL) tablet 25 mg (25 mg Oral Given 12/09/21 0636)    ED Course/ Medical Decision Making/ A&P                           Medical Decision Making Patient with HTN presents with HTN and 2 nights of LUE pain   Amount and/or Complexity of Data Reviewed Independent Historian: spouse    Details: See above  External Data Reviewed: notes.    Details: Previous notes reviewed   Labs: ordered.    Details: All labs reviewed:  WBC 4.8 Normal white count   Hemoglobin 12.7 Normal   Platelets 278 normal   Basic metabolic panel   Sodium 017 Normal   Potassium 3.7 Normal   Chloride 101   CO2 26 Normal   Glucose, Bld 175 high   BUN 13 8 - 23 mg/dL  Creatinine, Ser 0.71 Normal   Troponin I (High Sensitivity) 3 normal   Radiology: ordered and independent interpretation performed.    Details: Negative CXR by me  ECG/medicine tests: ordered and independent interpretation performed. Decision-making details documented in ED Course.  Risk Prescription drug management.    Final Clinical Impression(s) / ED Diagnoses Final diagnoses:  Primary hypertension  Pain of left upper extremity   Signed out to Dr. Ronnald Nian pending delta troponin Rx / DC Orders ED Discharge Orders     None         Eliese Kerwood, MD 12/09/21 [phone]

## 2021-12-09 NOTE — ED Triage Notes (Signed)
Pt states her blood pressure is high and her left arm is hurting   Pt states this has been going off and on all week   Pt states she has been taking her medication as directed

## 2022-01-05 DIAGNOSIS — E7800 Pure hypercholesterolemia, unspecified: Secondary | ICD-10-CM | POA: Diagnosis not present

## 2022-01-05 DIAGNOSIS — E1169 Type 2 diabetes mellitus with other specified complication: Secondary | ICD-10-CM | POA: Diagnosis not present

## 2022-01-05 DIAGNOSIS — I10 Essential (primary) hypertension: Secondary | ICD-10-CM | POA: Diagnosis not present

## 2022-01-05 DIAGNOSIS — M810 Age-related osteoporosis without current pathological fracture: Secondary | ICD-10-CM | POA: Diagnosis not present

## 2022-01-05 DIAGNOSIS — Z0000 Encounter for general adult medical examination without abnormal findings: Secondary | ICD-10-CM | POA: Diagnosis not present

## 2022-01-10 DIAGNOSIS — I10 Essential (primary) hypertension: Secondary | ICD-10-CM | POA: Insufficient documentation

## 2022-01-10 DIAGNOSIS — R319 Hematuria, unspecified: Secondary | ICD-10-CM | POA: Insufficient documentation

## 2022-01-10 DIAGNOSIS — E785 Hyperlipidemia, unspecified: Secondary | ICD-10-CM | POA: Insufficient documentation

## 2022-01-10 DIAGNOSIS — H269 Unspecified cataract: Secondary | ICD-10-CM | POA: Insufficient documentation

## 2022-01-10 DIAGNOSIS — M8580 Other specified disorders of bone density and structure, unspecified site: Secondary | ICD-10-CM | POA: Insufficient documentation

## 2022-01-18 ENCOUNTER — Other Ambulatory Visit: Payer: Self-pay

## 2022-01-20 ENCOUNTER — Encounter: Payer: Self-pay | Admitting: Cardiology

## 2022-01-20 ENCOUNTER — Ambulatory Visit: Payer: PPO | Attending: Cardiology | Admitting: Cardiology

## 2022-01-20 VITALS — BP 136/66 | HR 90 | Ht 66.5 in | Wt 131.0 lb

## 2022-01-20 DIAGNOSIS — I10 Essential (primary) hypertension: Secondary | ICD-10-CM | POA: Diagnosis not present

## 2022-01-20 DIAGNOSIS — E782 Mixed hyperlipidemia: Secondary | ICD-10-CM

## 2022-01-20 DIAGNOSIS — R011 Cardiac murmur, unspecified: Secondary | ICD-10-CM | POA: Diagnosis not present

## 2022-01-20 DIAGNOSIS — E119 Type 2 diabetes mellitus without complications: Secondary | ICD-10-CM | POA: Diagnosis not present

## 2022-01-20 NOTE — Patient Instructions (Addendum)
Medication Instructions:  Your physician recommends that you continue on your current medications as directed. Please refer to the Current Medication list given to you today.  *If you need a refill on your cardiac medications before your next appointment, please call your pharmacy*   Lab Work: Your physician recommends that you return for lab work in:   Labs in 3 months: BMP, LFT, Lipid  If you have labs (blood work) drawn today and your tests are completely normal, you will receive your results only by: MyChart Message (if you have MyChart) OR A paper copy in the mail If you have any lab test that is abnormal or we need to change your treatment, we will call you to review the results.   Testing/Procedures: Your physician has requested that you have an echocardiogram. Echocardiography is a painless test that uses sound waves to create images of your heart. It provides your doctor with information about the size and shape of your heart and how well your heart's chambers and valves are working. This procedure takes approximately one hour. There are no restrictions for this procedure. Please do NOT wear cologne, perfume, aftershave, or lotions (deodorant is allowed). Please arrive 15 minutes prior to your appointment time.    Follow-Up: At [facility], you and your health needs are our priority.  As part of our continuing mission to provide you with exceptional heart care, we have created designated Provider Care Teams.  These Care Teams include your primary Cardiologist (physician) and Advanced Practice Providers (APPs -  Physician Assistants and Nurse Practitioners) who all work together to provide you with the care you need, when you need it.  We recommend signing up for the patient portal called "MyChart".  Sign up information is provided on this After Visit Summary.  MyChart is used to connect with patients for Virtual Visits (Telemedicine).  Patients are able to view lab/test  results, encounter notes, upcoming appointments, etc.  Non-urgent messages can be sent to your provider as well.   To learn more about what you can do with MyChart, go to [email or web address].    Your next appointment:   1 year(s)  The format for your next appointment:   In Person  Provider:   Belva Crome, MD    Other Instructions   Important Information About Sugar

## 2022-01-20 NOTE — Progress Notes (Signed)
Cardiology Office Note:    Date:  01/20/2022   ID:  Kristen Potter, DOB 1950-04-13, MRN [id number]  PCP:  Renford Dills, MD  Cardiologist:  Garwin Brothers, MD   Referring MD: Virgina Norfolk, DO    ASSESSMENT:    1. Primary hypertension   2. Diabetes mellitus without complication (HCC)   3. Mixed hyperlipidemia   4. Cardiac murmur    PLAN:    In order of problems listed above:  Primary prevention stressed with the patient.  Importance of compliance with diet medication stressed and she vocalized understanding. Mixed dyslipidemia: Markedly elevated lipids.  Diet emphasized.  Lipid education was given.  I discussed this with her at length.  She is not taking her Crestor on a regular basis.  She will start doing so regularly and will be back in 3 months for a liver lipid check.  Goal LDL must be less than 60 in view of diabetes.  This was conveyed to her. Essential hypertension: Blood pressure stable and diet was emphasized. Diabetes mellitus: On lipid-lowering medications.  Hemoglobin A1c is greater than 8 and I discussed this with her and cautioned her and lifestyle modification urged. Patient will be seen in follow-up appointment in 6 months or earlier if the patient has any concerns    Medication Adjustments/Labs and Tests Ordered: Current medicines are reviewed at length with the patient today.  Concerns regarding medicines are outlined above.  No orders of the defined types were placed in this encounter.  No orders of the defined types were placed in this encounter.    History of Present Illness:    Kristen Potter is a 71 y.o. female who is being seen today for the evaluation of mixed dyslipidemia patient is a pleasant 71 year old female.  She is past medical history of essential hypertension mixed dyslipidemia and diabetes mellitus.  She denies any problems at this time and takes care of activities of daily living.  No chest pain orthopnea or PND.  She is seen at the  request of Virgina Norfolk, DO.  She mentions to me that she exercises on a regular basis.  She jumps on a trampoline 30 to 45 minutes a day without any problems.  When the weather is fine she walks outside.  At the time of my evaluation, the patient is alert awake oriented and in no distress.  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 No detectable diabetic retinopathy    Past Surgical History:  Procedure Laterality Date   EYE SURGERY     FOOT SURGERY Right    VAGINAL HYSTERECTOMY      Current Medications: Current Meds  Medication Sig   alendronate (FOSAMAX) 70 MG tablet Take 70 mg by mouth once a week.   amoxicillin (AMOXIL) 500 MG capsule Take 2 capsules (1,000 mg total) by mouth 2 (two) times daily. (Patient taking differently: Take 1,000 mg by mouth 2 (two) times daily. Prior to Dental Procedures)   Calcium Carb-Cholecalciferol (CALCIUM 600 + D  PO) Take 1 tablet by mouth daily.   glipiZIDE (GLUCOTROL XL) 2.5 MG 24 hr tablet Take 2.5 mg by mouth daily with breakfast.   hydrochlorothiazide (HYDRODIURIL) 25 MG tablet Take 25 mg by mouth daily.   lisinopril (PRINIVIL,ZESTRIL) 20 MG tablet Take 20 mg by mouth daily.   metFORMIN (GLUCOPHAGE) 1000 MG tablet Take 1,000 mg by mouth 2 (two) times daily.   rosuvastatin (CRESTOR) 20 MG tablet Take 20 mg by mouth daily.     Allergies:   Aspirin and Latex   Social History   Socioeconomic History   Marital status: Married    Spouse name: Not on file   Number of children: Not on file    Years of education: Not on file   Highest education level: Not on file  Occupational History   Not on file  Tobacco Use   Smoking status: Former   Smokeless tobacco: Never  Vaping Use   Vaping Use: Never used  Substance and Sexual Activity   Alcohol use: Yes    Comment: social   Drug use: Never   Sexual activity: Not on file  Other Topics Concern   Not on file  Social History Narrative   Not on file   Social Determinants of Health   Financial Resource Strain: Not on file  Food Insecurity: Not on file  Transportation Needs: Not on file  Physical Activity: Not on file  Stress: Not on file  Social Connections: Not on file     Family History: The patient's family history includes Cancer in an other family member; Diabetes in an other family member.  ROS:   Please see the history of present illness.    All other systems reviewed and are negative.  EKGs/Labs/Other Studies Reviewed:    The following studies were reviewed today: EKG reveals sinus rhythm and nonspecific ST-T changes and was done in the ER.   Recent Labs: 12/09/2021: BUN 13; Creatinine, Ser 0.71; Hemoglobin 12.7; Platelets 278; Potassium 3.7; Sodium 136  Recent Lipid Panel    Component Value Date/Time   CHOL  08/07/2007 0615    184        ATP III CLASSIFICATION:  <200     mg/dL   Desirable  297-989  mg/dL   Borderline High  >=211    mg/dL   High   TRIG 25 94/17/4081 0615   HDL 69 08/07/2007 0615   CHOLHDL 2.7 08/07/2007 0615   VLDL 5 08/07/2007 0615   LDLCALC (H) 08/07/2007 0615    110        Total Cholesterol/HDL:CHD Risk Coronary Heart Disease Risk Table                     Men   Women  1/2 Average Risk   3.4   3.3    Physical Exam:    VS:  BP 136/66 (BP Location: Left Arm, Patient Position: Sitting, Cuff Size: Small)   Pulse 90   Ht 5' 6.5" (1.689 m)   Wt 131 lb (59.4 kg)   SpO2 100%   BMI 20.83 kg/m     Wt Readings from Last 3 Encounters:  01/20/22 131 lb (59.4 kg)  12/09/21 132  lb (59.9 kg)  11/05/15 143 lb (64.9 kg)     GEN: Patient is in no acute distress HEENT: Normal NECK: No JVD; No carotid bruits LYMPHATICS: No lymphadenopathy CARDIAC: S1 S2 regular, 2/6 systolic murmur at the apex. RESPIRATORY:  Clear to auscultation without rales,  wheezing or rhonchi  ABDOMEN: Soft, non-tender, non-distended MUSCULOSKELETAL:  No edema; No deformity  SKIN: Warm and dry NEUROLOGIC:  Alert and oriented x 3 PSYCHIATRIC:  Normal affect    Signed, Garwin Brothers, MD  01/20/2022 10:59 AM    [facility]

## 2022-02-28 DIAGNOSIS — Z1231 Encounter for screening mammogram for malignant neoplasm of breast: Secondary | ICD-10-CM | POA: Diagnosis not present

## 2022-03-02 DIAGNOSIS — R21 Rash and other nonspecific skin eruption: Secondary | ICD-10-CM | POA: Diagnosis not present

## 2022-03-08 ENCOUNTER — Ambulatory Visit (HOSPITAL_BASED_OUTPATIENT_CLINIC_OR_DEPARTMENT_OTHER)
Admission: RE | Admit: 2022-03-08 | Discharge: 2022-03-08 | Disposition: A | Discharge status: Home / Self Care | Payer: PPO | Source: Ambulatory Visit | Attending: Cardiology | Admitting: Cardiology

## 2022-03-08 DIAGNOSIS — E782 Mixed hyperlipidemia: Secondary | ICD-10-CM

## 2022-03-08 DIAGNOSIS — R011 Cardiac murmur, unspecified: Secondary | ICD-10-CM

## 2022-03-08 DIAGNOSIS — I10 Essential (primary) hypertension: Secondary | ICD-10-CM | POA: Diagnosis not present

## 2022-03-08 DIAGNOSIS — E119 Type 2 diabetes mellitus without complications: Secondary | ICD-10-CM | POA: Diagnosis not present

## 2022-03-09 LAB — ECHOCARDIOGRAM COMPLETE
AR max vel: 3.01 cm2
AV Area VTI: 2.69 cm2
AV Area mean vel: 2.49 cm2
AV Mean grad: 2 mmHg
AV Peak grad: 3 mmHg
Ao pk vel: 0.86 m/s
Area-P 1/2: 2.9 cm2
Calc EF: 61.4 %
S' Lateral: 2.6 cm
Single Plane A2C EF: 57.4 %
Single Plane A4C EF: 64.7 %

## 2022-04-28 DIAGNOSIS — B353 Tinea pedis: Secondary | ICD-10-CM | POA: Diagnosis not present

## 2022-05-03 DIAGNOSIS — H524 Presbyopia: Secondary | ICD-10-CM | POA: Diagnosis not present

## 2022-05-03 DIAGNOSIS — Z961 Presence of intraocular lens: Secondary | ICD-10-CM | POA: Diagnosis not present

## 2022-05-03 DIAGNOSIS — E119 Type 2 diabetes mellitus without complications: Secondary | ICD-10-CM | POA: Diagnosis not present

## 2022-05-22 ENCOUNTER — Encounter (INDEPENDENT_AMBULATORY_CARE_PROVIDER_SITE_OTHER): Payer: PPO | Admitting: Ophthalmology

## 2022-06-28 DIAGNOSIS — H348122 Central retinal vein occlusion, left eye, stable: Secondary | ICD-10-CM | POA: Diagnosis not present

## 2022-06-28 DIAGNOSIS — E119 Type 2 diabetes mellitus without complications: Secondary | ICD-10-CM | POA: Diagnosis not present

## 2022-06-28 DIAGNOSIS — H35352 Cystoid macular degeneration, left eye: Secondary | ICD-10-CM | POA: Diagnosis not present

## 2022-07-06 DIAGNOSIS — M810 Age-related osteoporosis without current pathological fracture: Secondary | ICD-10-CM | POA: Diagnosis not present

## 2022-07-06 DIAGNOSIS — E1169 Type 2 diabetes mellitus with other specified complication: Secondary | ICD-10-CM | POA: Diagnosis not present

## 2022-07-06 DIAGNOSIS — E7800 Pure hypercholesterolemia, unspecified: Secondary | ICD-10-CM | POA: Diagnosis not present

## 2022-07-06 DIAGNOSIS — E119 Type 2 diabetes mellitus without complications: Secondary | ICD-10-CM | POA: Diagnosis not present

## 2022-07-06 DIAGNOSIS — I10 Essential (primary) hypertension: Secondary | ICD-10-CM | POA: Diagnosis not present

## 2022-07-06 DIAGNOSIS — B353 Tinea pedis: Secondary | ICD-10-CM | POA: Diagnosis not present

## 2022-08-01 DIAGNOSIS — R194 Change in bowel habit: Secondary | ICD-10-CM | POA: Diagnosis not present

## 2022-08-01 DIAGNOSIS — R159 Full incontinence of feces: Secondary | ICD-10-CM | POA: Diagnosis not present

## 2022-08-25 DIAGNOSIS — D649 Anemia, unspecified: Secondary | ICD-10-CM | POA: Diagnosis not present

## 2023-02-23 ENCOUNTER — Other Ambulatory Visit: Payer: Self-pay | Admitting: Internal Medicine

## 2023-02-23 DIAGNOSIS — R0989 Other specified symptoms and signs involving the circulatory and respiratory systems: Secondary | ICD-10-CM

## 2023-03-09 ENCOUNTER — Ambulatory Visit
Admission: RE | Admit: 2023-03-09 | Discharge: 2023-03-09 | Disposition: A | Discharge status: Home / Self Care | Payer: PPO | Source: Ambulatory Visit | Attending: Internal Medicine | Admitting: Internal Medicine

## 2023-03-09 DIAGNOSIS — R0989 Other specified symptoms and signs involving the circulatory and respiratory systems: Secondary | ICD-10-CM

## 2023-06-28 DIAGNOSIS — H35372 Puckering of macula, left eye: Secondary | ICD-10-CM | POA: Diagnosis not present

## 2023-06-28 DIAGNOSIS — H348122 Central retinal vein occlusion, left eye, stable: Secondary | ICD-10-CM | POA: Diagnosis not present

## 2023-06-28 DIAGNOSIS — H35352 Cystoid macular degeneration, left eye: Secondary | ICD-10-CM | POA: Diagnosis not present

## 2023-06-28 DIAGNOSIS — E119 Type 2 diabetes mellitus without complications: Secondary | ICD-10-CM | POA: Diagnosis not present

## 2023-09-06 DIAGNOSIS — M8588 Other specified disorders of bone density and structure, other site: Secondary | ICD-10-CM | POA: Diagnosis not present

## 2023-09-06 DIAGNOSIS — E7800 Pure hypercholesterolemia, unspecified: Secondary | ICD-10-CM | POA: Diagnosis not present

## 2023-09-06 DIAGNOSIS — E1169 Type 2 diabetes mellitus with other specified complication: Secondary | ICD-10-CM | POA: Diagnosis not present

## 2023-09-06 DIAGNOSIS — I10 Essential (primary) hypertension: Secondary | ICD-10-CM | POA: Diagnosis not present

## 2023-09-10 DIAGNOSIS — H348122 Central retinal vein occlusion, left eye, stable: Secondary | ICD-10-CM | POA: Diagnosis not present

## 2023-09-10 DIAGNOSIS — H35372 Puckering of macula, left eye: Secondary | ICD-10-CM | POA: Diagnosis not present

## 2023-09-10 DIAGNOSIS — H35352 Cystoid macular degeneration, left eye: Secondary | ICD-10-CM | POA: Diagnosis not present

## 2023-09-10 DIAGNOSIS — E119 Type 2 diabetes mellitus without complications: Secondary | ICD-10-CM | POA: Diagnosis not present

## 2023-09-10 DIAGNOSIS — H353111 Nonexudative age-related macular degeneration, right eye, early dry stage: Secondary | ICD-10-CM | POA: Diagnosis not present

## 2023-12-10 DIAGNOSIS — H35352 Cystoid macular degeneration, left eye: Secondary | ICD-10-CM | POA: Diagnosis not present

## 2023-12-10 DIAGNOSIS — H353111 Nonexudative age-related macular degeneration, right eye, early dry stage: Secondary | ICD-10-CM | POA: Diagnosis not present

## 2023-12-10 DIAGNOSIS — E113391 Type 2 diabetes mellitus with moderate nonproliferative diabetic retinopathy without macular edema, right eye: Secondary | ICD-10-CM | POA: Diagnosis not present

## 2023-12-10 DIAGNOSIS — H34812 Central retinal vein occlusion, left eye, with macular edema: Secondary | ICD-10-CM | POA: Diagnosis not present

## 2023-12-10 DIAGNOSIS — H35372 Puckering of macula, left eye: Secondary | ICD-10-CM | POA: Diagnosis not present

## 2024-01-03 DIAGNOSIS — E7800 Pure hypercholesterolemia, unspecified: Secondary | ICD-10-CM | POA: Diagnosis not present

## 2024-01-03 DIAGNOSIS — I10 Essential (primary) hypertension: Secondary | ICD-10-CM | POA: Diagnosis not present

## 2024-01-03 DIAGNOSIS — R519 Headache, unspecified: Secondary | ICD-10-CM | POA: Diagnosis not present
